# Patient Record
Sex: Female | Born: 1985 | Race: White | Hispanic: No | Marital: Single | Smoking: Current every day smoker
Health system: Southern US, Community
[De-identification: ages and names within clinical notes are randomized; demographics above are authoritative.]

## PROBLEM LIST (undated history)

## (undated) VITALS — BP 108/75 | HR 94 | Temp 98.5°F | Resp 18 | Ht 61.0 in | Wt 92.5 lb

## (undated) DIAGNOSIS — IMO0002 Reserved for concepts with insufficient information to code with codable children: Secondary | ICD-10-CM

## (undated) DIAGNOSIS — F99 Mental disorder, not otherwise specified: Secondary | ICD-10-CM

## (undated) DIAGNOSIS — F319 Bipolar disorder, unspecified: Secondary | ICD-10-CM

## (undated) DIAGNOSIS — R87619 Unspecified abnormal cytological findings in specimens from cervix uteri: Secondary | ICD-10-CM

## (undated) DIAGNOSIS — F32A Depression, unspecified: Secondary | ICD-10-CM

## (undated) DIAGNOSIS — F419 Anxiety disorder, unspecified: Secondary | ICD-10-CM

## (undated) DIAGNOSIS — F329 Major depressive disorder, single episode, unspecified: Secondary | ICD-10-CM

## (undated) DIAGNOSIS — N83209 Unspecified ovarian cyst, unspecified side: Secondary | ICD-10-CM

## (undated) HISTORY — DX: Bipolar disorder, unspecified: F31.9

## (undated) HISTORY — PX: WISDOM TOOTH EXTRACTION: SHX21

## (undated) HISTORY — PX: COLPOSCOPY: SHX161

---
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
## 2006-01-10 ENCOUNTER — Ambulatory Visit: Payer: Self-pay | Admitting: Hematology & Oncology

## 2006-03-18 ENCOUNTER — Ambulatory Visit: Payer: Self-pay | Admitting: Hematology & Oncology

## 2006-03-23 LAB — CBC WITH DIFFERENTIAL/PLATELET
BASO%: 0.5 % (ref 0.0–2.0)
Eosinophils Absolute: 0.1 10*3/uL (ref 0.0–0.5)
MCHC: 35.3 g/dL (ref 32.0–36.0)
MONO#: 0.4 10*3/uL (ref 0.1–0.9)
NEUT#: 2.6 10*3/uL (ref 1.5–6.5)
RBC: 4.68 10*6/uL (ref 3.70–5.32)
WBC: 5.3 10*3/uL (ref 3.9–10.0)
lymph#: 2.2 10*3/uL (ref 0.9–3.3)

## 2006-03-23 LAB — CHCC SMEAR

## 2006-03-27 LAB — JAK2 GENOTYPR

## 2006-03-27 LAB — FERRITIN: Ferritin: 16 ng/mL (ref 10–291)

## 2006-09-17 ENCOUNTER — Ambulatory Visit: Payer: Self-pay | Admitting: Hematology & Oncology

## 2006-09-21 LAB — CBC WITH DIFFERENTIAL/PLATELET
BASO%: 0.4 % (ref 0.0–2.0)
LYMPH%: 39.5 % (ref 14.0–48.0)
MCHC: 35.4 g/dL (ref 32.0–36.0)
MONO#: 0.5 10*3/uL (ref 0.1–0.9)
NEUT#: 4.1 10*3/uL (ref 1.5–6.5)
RBC: 4.56 10*6/uL (ref 3.70–5.32)
RDW: 12.3 % (ref 11.3–14.5)
WBC: 7.8 10*3/uL (ref 3.9–10.0)
lymph#: 3.1 10*3/uL (ref 0.9–3.3)

## 2006-09-21 LAB — CHCC SMEAR

## 2006-12-22 ENCOUNTER — Other Ambulatory Visit: Admission: RE | Admit: 2006-12-22 | Discharge: 2006-12-22 | Payer: Self-pay | Admitting: Family Medicine

## 2007-12-23 ENCOUNTER — Other Ambulatory Visit: Admission: RE | Admit: 2007-12-23 | Discharge: 2007-12-23 | Payer: Self-pay | Admitting: Family Medicine

## 2009-04-16 ENCOUNTER — Ambulatory Visit (HOSPITAL_COMMUNITY): Admission: RE | Admit: 2009-04-16 | Discharge: 2009-04-16 | Payer: Self-pay | Admitting: Obstetrics & Gynecology

## 2009-07-14 ENCOUNTER — Inpatient Hospital Stay (HOSPITAL_COMMUNITY): Admission: AD | Admit: 2009-07-14 | Discharge: 2009-07-15 | Payer: Self-pay | Admitting: Obstetrics & Gynecology

## 2009-07-14 ENCOUNTER — Ambulatory Visit: Payer: Self-pay | Admitting: Advanced Practice Midwife

## 2009-08-02 ENCOUNTER — Inpatient Hospital Stay (HOSPITAL_COMMUNITY): Admission: AD | Admit: 2009-08-02 | Discharge: 2009-08-04 | Payer: Self-pay | Admitting: Obstetrics & Gynecology

## 2009-08-27 ENCOUNTER — Inpatient Hospital Stay (HOSPITAL_COMMUNITY): Admission: AD | Admit: 2009-08-27 | Discharge: 2009-08-27 | Payer: Self-pay | Admitting: Obstetrics and Gynecology

## 2009-08-29 ENCOUNTER — Inpatient Hospital Stay (HOSPITAL_COMMUNITY): Admission: AD | Admit: 2009-08-29 | Discharge: 2009-08-29 | Payer: Self-pay | Admitting: Obstetrics & Gynecology

## 2009-08-29 ENCOUNTER — Ambulatory Visit: Payer: Self-pay | Admitting: Family Medicine

## 2009-09-03 ENCOUNTER — Ambulatory Visit: Payer: Self-pay | Admitting: Obstetrics and Gynecology

## 2009-09-03 ENCOUNTER — Inpatient Hospital Stay (HOSPITAL_COMMUNITY): Admission: AD | Admit: 2009-09-03 | Discharge: 2009-09-04 | Payer: Self-pay | Admitting: Obstetrics & Gynecology

## 2009-09-17 ENCOUNTER — Inpatient Hospital Stay (HOSPITAL_COMMUNITY): Admission: AD | Admit: 2009-09-17 | Discharge: 2009-09-17 | Payer: Self-pay | Admitting: Obstetrics & Gynecology

## 2009-09-21 ENCOUNTER — Ambulatory Visit: Payer: Self-pay | Admitting: Obstetrics & Gynecology

## 2009-09-21 ENCOUNTER — Ambulatory Visit (HOSPITAL_COMMUNITY): Admission: RE | Admit: 2009-09-21 | Discharge: 2009-09-21 | Payer: Self-pay | Admitting: Family Medicine

## 2009-09-23 ENCOUNTER — Inpatient Hospital Stay (HOSPITAL_COMMUNITY): Admission: AD | Admit: 2009-09-23 | Discharge: 2009-09-23 | Payer: Self-pay | Admitting: Obstetrics & Gynecology

## 2009-09-24 ENCOUNTER — Inpatient Hospital Stay (HOSPITAL_COMMUNITY): Admission: AD | Admit: 2009-09-24 | Discharge: 2009-09-26 | Payer: Self-pay | Admitting: Obstetrics & Gynecology

## 2009-09-24 ENCOUNTER — Ambulatory Visit: Payer: Self-pay | Admitting: Family Medicine

## 2010-04-26 LAB — CBC
HCT: 40 % (ref 36.0–46.0)
Hemoglobin: 13.3 g/dL (ref 12.0–15.0)
MCH: 31.6 pg (ref 26.0–34.0)
MCHC: 33.3 g/dL (ref 30.0–36.0)
RBC: 4.21 MIL/uL (ref 3.87–5.11)
WBC: 15 10*3/uL — ABNORMAL HIGH (ref 4.0–10.5)

## 2010-04-27 LAB — COMPREHENSIVE METABOLIC PANEL
Alkaline Phosphatase: 184 U/L — ABNORMAL HIGH (ref 39–117)
BUN: 4 mg/dL — ABNORMAL LOW (ref 6–23)
CO2: 27 mEq/L (ref 19–32)
Chloride: 103 mEq/L (ref 96–112)
Creatinine, Ser: 0.56 mg/dL (ref 0.4–1.2)
GFR calc Af Amer: >60 mL/min (ref >60)
GFR calc non Af Amer: >60 mL/min (ref >60)
Glucose, Bld: 73 mg/dL (ref 70–99)
Potassium: 3.6 mEq/L (ref 3.5–5.1)
Total Bilirubin: 0.6 mg/dL (ref 0.3–1.2)

## 2010-04-27 LAB — URINALYSIS, ROUTINE W REFLEX MICROSCOPIC
Bilirubin Urine: NEGATIVE
Nitrite: NEGATIVE
Protein, ur: NEGATIVE mg/dL
Specific Gravity, Urine: 1.015 (ref 1.005–1.030)
Urobilinogen, UA: 0.2 mg/dL (ref 0.0–1.0)

## 2010-04-27 LAB — CBC
HCT: 34 % — ABNORMAL LOW (ref 36.0–46.0)
Hemoglobin: 11.8 g/dL — ABNORMAL LOW (ref 12.0–15.0)
WBC: 11.6 10*3/uL — ABNORMAL HIGH (ref 4.0–10.5)

## 2010-04-28 LAB — URINALYSIS, ROUTINE W REFLEX MICROSCOPIC
Bilirubin Urine: NEGATIVE
Glucose, UA: NEGATIVE mg/dL
Hgb urine dipstick: NEGATIVE
Specific Gravity, Urine: 1.01 (ref 1.005–1.030)
pH: 6 (ref 5.0–8.0)

## 2010-04-28 LAB — CBC
HCT: 31.8 % — ABNORMAL LOW (ref 36.0–46.0)
Hemoglobin: 11.2 g/dL — ABNORMAL LOW (ref 12.0–15.0)
MCV: 92.5 fL (ref 78.0–100.0)
RBC: 3.44 MIL/uL — ABNORMAL LOW (ref 3.87–5.11)
WBC: 10.5 10*3/uL (ref 4.0–10.5)

## 2010-04-28 LAB — KLEIHAUER-BETKE STAIN: Quantitation Fetal Hemoglobin: 0 mL

## 2010-04-28 LAB — GC/CHLAMYDIA PROBE AMP, GENITAL
Chlamydia, DNA Probe: NEGATIVE
GC Probe Amp, Genital: NEGATIVE

## 2010-04-28 LAB — URINE MICROSCOPIC-ADD ON

## 2010-04-28 LAB — STREP B DNA PROBE

## 2010-04-28 LAB — WET PREP, GENITAL: Yeast Wet Prep HPF POC: NONE SEEN

## 2010-04-29 LAB — WET PREP, GENITAL
Trich, Wet Prep: NONE SEEN
Yeast Wet Prep HPF POC: NONE SEEN

## 2010-04-29 LAB — URINALYSIS, ROUTINE W REFLEX MICROSCOPIC
Nitrite: NEGATIVE
Specific Gravity, Urine: <1.005 — ABNORMAL LOW (ref 1.005–1.030)
Urobilinogen, UA: 0.2 mg/dL (ref 0.0–1.0)
pH: 5.5 (ref 5.0–8.0)

## 2010-04-29 LAB — URINE MICROSCOPIC-ADD ON

## 2010-07-26 ENCOUNTER — Emergency Department (HOSPITAL_COMMUNITY)
Admission: EM | Admit: 2010-07-26 | Discharge: 2010-07-26 | Disposition: A | Discharge status: Home / Self Care | Payer: Medicaid Other | Attending: Emergency Medicine | Admitting: Emergency Medicine

## 2010-07-26 ENCOUNTER — Emergency Department (HOSPITAL_COMMUNITY): Payer: Medicaid Other

## 2010-07-26 DIAGNOSIS — N739 Female pelvic inflammatory disease, unspecified: Secondary | ICD-10-CM | POA: Insufficient documentation

## 2010-07-26 DIAGNOSIS — N760 Acute vaginitis: Secondary | ICD-10-CM | POA: Insufficient documentation

## 2010-07-26 DIAGNOSIS — B9689 Other specified bacterial agents as the cause of diseases classified elsewhere: Secondary | ICD-10-CM | POA: Insufficient documentation

## 2010-07-26 DIAGNOSIS — M545 Low back pain, unspecified: Secondary | ICD-10-CM | POA: Insufficient documentation

## 2010-07-26 DIAGNOSIS — R300 Dysuria: Secondary | ICD-10-CM | POA: Insufficient documentation

## 2010-07-26 DIAGNOSIS — R5381 Other malaise: Secondary | ICD-10-CM | POA: Insufficient documentation

## 2010-07-26 DIAGNOSIS — R5383 Other fatigue: Secondary | ICD-10-CM | POA: Insufficient documentation

## 2010-07-26 DIAGNOSIS — N949 Unspecified condition associated with female genital organs and menstrual cycle: Secondary | ICD-10-CM | POA: Insufficient documentation

## 2010-07-26 DIAGNOSIS — A499 Bacterial infection, unspecified: Secondary | ICD-10-CM | POA: Insufficient documentation

## 2010-07-26 DIAGNOSIS — Z30432 Encounter for removal of intrauterine contraceptive device: Secondary | ICD-10-CM | POA: Insufficient documentation

## 2010-07-26 DIAGNOSIS — R110 Nausea: Secondary | ICD-10-CM | POA: Insufficient documentation

## 2010-07-26 LAB — URINALYSIS, ROUTINE W REFLEX MICROSCOPIC
Glucose, UA: NEGATIVE mg/dL
Hgb urine dipstick: NEGATIVE
Protein, ur: NEGATIVE mg/dL
pH: 7 (ref 5.0–8.0)

## 2010-07-26 LAB — POCT I-STAT, CHEM 8
Creatinine, Ser: 0.6 mg/dL (ref 0.50–1.10)
HCT: 42 % (ref 36.0–46.0)
Hemoglobin: 14.3 g/dL (ref 12.0–15.0)
Potassium: 4 mEq/L (ref 3.5–5.1)
Sodium: 139 mEq/L (ref 135–145)
TCO2: 24 mmol/L (ref 0–100)

## 2010-07-26 LAB — CBC
HCT: 40 % (ref 36.0–46.0)
Hemoglobin: 13.4 g/dL (ref 12.0–15.0)
MCH: 29 pg (ref 26.0–34.0)
MCHC: 33.5 g/dL (ref 30.0–36.0)
MCV: 86.6 fL (ref 78.0–100.0)
Platelets: 379 10*3/uL (ref 150–400)

## 2010-07-26 LAB — DIFFERENTIAL
Lymphocytes Relative: 40 % (ref 12–46)
Lymphs Abs: 3.6 10*3/uL (ref 0.7–4.0)
Monocytes Absolute: 0.4 10*3/uL (ref 0.1–1.0)
Monocytes Relative: 5 % (ref 3–12)
Neutro Abs: 4.8 10*3/uL (ref 1.7–7.7)

## 2010-07-26 LAB — WET PREP, GENITAL: Yeast Wet Prep HPF POC: NONE SEEN

## 2010-07-29 ENCOUNTER — Inpatient Hospital Stay (HOSPITAL_COMMUNITY)
Admission: AD | Admit: 2010-07-29 | Discharge: 2010-07-29 | Disposition: A | Discharge status: Home / Self Care | Payer: Medicaid Other | Source: Ambulatory Visit | Attending: Obstetrics and Gynecology | Admitting: Obstetrics and Gynecology

## 2010-07-29 DIAGNOSIS — N949 Unspecified condition associated with female genital organs and menstrual cycle: Secondary | ICD-10-CM

## 2010-07-29 DIAGNOSIS — R109 Unspecified abdominal pain: Secondary | ICD-10-CM

## 2010-07-29 DIAGNOSIS — N938 Other specified abnormal uterine and vaginal bleeding: Secondary | ICD-10-CM | POA: Insufficient documentation

## 2010-07-29 LAB — URINALYSIS, ROUTINE W REFLEX MICROSCOPIC
Bilirubin Urine: NEGATIVE
Glucose, UA: NEGATIVE mg/dL
Protein, ur: NEGATIVE mg/dL
Specific Gravity, Urine: <1.005 — ABNORMAL LOW (ref 1.005–1.030)
Urobilinogen, UA: 0.2 mg/dL (ref 0.0–1.0)

## 2010-07-29 LAB — CBC
HCT: 40.5 % (ref 36.0–46.0)
MCH: 30.2 pg (ref 26.0–34.0)
MCV: 87.5 fL (ref 78.0–100.0)
Platelets: 369 10*3/uL (ref 150–400)
RDW: 12.9 % (ref 11.5–15.5)
WBC: 7.4 10*3/uL (ref 4.0–10.5)

## 2010-07-29 LAB — URINE MICROSCOPIC-ADD ON

## 2011-01-05 ENCOUNTER — Emergency Department (HOSPITAL_COMMUNITY): Payer: Medicaid Other

## 2011-01-05 ENCOUNTER — Emergency Department (HOSPITAL_COMMUNITY)
Admission: EM | Admit: 2011-01-05 | Discharge: 2011-01-05 | Disposition: A | Discharge status: Home / Self Care | Payer: Medicaid Other | Attending: Emergency Medicine | Admitting: Emergency Medicine

## 2011-01-05 DIAGNOSIS — R059 Cough, unspecified: Secondary | ICD-10-CM | POA: Insufficient documentation

## 2011-01-05 DIAGNOSIS — IMO0001 Reserved for inherently not codable concepts without codable children: Secondary | ICD-10-CM | POA: Insufficient documentation

## 2011-01-05 DIAGNOSIS — R05 Cough: Secondary | ICD-10-CM | POA: Insufficient documentation

## 2011-01-05 DIAGNOSIS — J069 Acute upper respiratory infection, unspecified: Secondary | ICD-10-CM

## 2011-01-05 MED ORDER — GUAIFENESIN ER 1200 MG PO TB12: 1.0000 | ORAL_TABLET | Freq: Two times a day (BID) | ORAL | Status: DC

## 2011-01-05 MED ORDER — PROMETHAZINE-DM 6.25-15 MG/5ML PO SYRP: 5.0000 mL | ORAL_SOLUTION | ORAL | Status: AC | PRN

## 2011-01-05 NOTE — ED Provider Notes (Signed)
Evaluation and management procedures were performed by the PA/NP under my supervision/collaboration.   Mickell Birdwell, MD 01/05/11 1536 

## 2011-01-05 NOTE — ED Provider Notes (Signed)
History     CSN: [id number] Arrival date & time: 01/05/2011 12:29 PM   First MD Initiated Contact with Patient 01/05/11 1238      Chief Complaint  Patient presents with  . Cough    pt states sneezing coughing x1 week no relief with otc medications also c/o generalized body aches     (Consider location/radiation/quality/duration/timing/severity/associated sxs/prior treatment) Patient is a 25 y.o. female presenting with cough. The history is provided by the patient.  Cough Associated symptoms include rhinorrhea and myalgias. Pertinent negatives include no chest pain, no ear congestion, no headaches, no sore throat, no shortness of breath and no wheezing. She has tried decongestants for the symptoms. The treatment provided no relief.   Patient has a one-week history of cough, nasal congestion, and runny nose and myalgias.  She denies fever, nausea, vomiting, diarrhea, abdominal pain, chest pain, shortness of breath, or headache.  Patient states, that she has not tried anything other than over-the-counter cough and cold medication.  States, that she came is because the cough is keeping her up at night. History reviewed. No pertinent past medical history.  History reviewed. No pertinent past surgical history.  No family history on file.  History  Substance Use Topics  . Smoking status: Current Some Day Smoker  . Smokeless tobacco: Not on file  . Alcohol Use: Yes    OB History    Grav Para Term Preterm Abortions TAB SAB Ect Mult Living                  Review of Systems  HENT: Positive for rhinorrhea. Negative for sore throat.   Respiratory: Positive for cough. Negative for shortness of breath and wheezing.   Cardiovascular: Negative for chest pain.  Musculoskeletal: Positive for myalgias.  Neurological: Negative for headaches.   no pertinent positives and negatives are in the history of present illness  Allergies  Review of patient's allergies indicates no known  allergies.  Home Medications  No current outpatient prescriptions on file.  BP 106/68  Pulse 72  Temp(Src) 97.9 F (36.6 C) (Oral)  Resp 20  SpO2 99%  LMP 12/11/2010  Physical Exam  Nursing note and vitals reviewed. Constitutional: She appears well-developed and well-nourished. No distress.  HENT:  Head: Normocephalic and atraumatic. No trismus in the jaw.  Right Ear: Tympanic membrane and ear canal normal.  Left Ear: Tympanic membrane and ear canal normal.  Nose: Mucosal edema and rhinorrhea present.  Mouth/Throat: Oropharynx is clear and moist and mucous membranes are normal. Normal dentition. No uvula swelling. No oropharyngeal exudate, posterior oropharyngeal edema, posterior oropharyngeal erythema or tonsillar abscesses.  Neck: Normal range of motion. Neck supple.  Cardiovascular: Normal rate, regular rhythm and normal heart sounds.  Exam reveals no gallop and no friction rub.   No murmur heard. Pulmonary/Chest: Effort normal. No respiratory distress. She has no wheezes. She has no rales.  Skin: Skin is warm and dry. No rash noted.    ED Course  Procedures (including critical care time)  The patient has a negative chest x-ray        MDM   Facial be treated for a viral URI based on her history of present illness, physical exam and x-ray findings.  She is told to increase her fluids, Tylenol and Motrin for pain.  Return here for any worsening in her condition       Carlyle Dolly, [place] 01/05/11 1351

## 2011-01-05 NOTE — ED Notes (Signed)
Patient transported to X-ray 

## 2011-02-08 ENCOUNTER — Encounter (HOSPITAL_COMMUNITY): Payer: Self-pay

## 2011-02-08 ENCOUNTER — Inpatient Hospital Stay (HOSPITAL_COMMUNITY)
Admission: AD | Admit: 2011-02-08 | Discharge: 2011-02-08 | Disposition: A | Discharge status: Home / Self Care | Payer: Medicaid Other | Source: Ambulatory Visit | Attending: Obstetrics and Gynecology | Admitting: Obstetrics and Gynecology

## 2011-02-08 ENCOUNTER — Inpatient Hospital Stay (HOSPITAL_COMMUNITY): Payer: Medicaid Other

## 2011-02-08 DIAGNOSIS — R102 Pelvic and perineal pain: Secondary | ICD-10-CM

## 2011-02-08 DIAGNOSIS — N949 Unspecified condition associated with female genital organs and menstrual cycle: Secondary | ICD-10-CM | POA: Insufficient documentation

## 2011-02-08 HISTORY — DX: Unspecified abnormal cytological findings in specimens from cervix uteri: R87.619

## 2011-02-08 HISTORY — DX: Unspecified ovarian cyst, unspecified side: N83.209

## 2011-02-08 HISTORY — DX: Reserved for concepts with insufficient information to code with codable children: IMO0002

## 2011-02-08 LAB — WET PREP, GENITAL
Trich, Wet Prep: NONE SEEN
Yeast Wet Prep HPF POC: NONE SEEN

## 2011-02-08 LAB — URINALYSIS, ROUTINE W REFLEX MICROSCOPIC
Bilirubin Urine: NEGATIVE
Leukocytes, UA: NEGATIVE
Nitrite: NEGATIVE
Specific Gravity, Urine: 1.02 (ref 1.005–1.030)
Urobilinogen, UA: 0.2 mg/dL (ref 0.0–1.0)

## 2011-02-08 NOTE — Progress Notes (Signed)
Patient is here with c/o ongoing abdominal pain, she states that she had constant pain after her mirena iud was removed in June. She states that she was told that it is her ovaries. She c/o rlq pain, that is tender and intensifies with movement. She denies any vaginal bleeding or discharge.

## 2011-02-08 NOTE — Progress Notes (Signed)
Written and verbal d/c instructions given and understanding voiced. Threasa Heads CNM in earlier to discuss u/s results and d/c with pt.

## 2011-02-08 NOTE — Progress Notes (Signed)
Pt reports having abd cramping on and off  X 1 week. Though it was an ovarian cyst. Pt reports  abd is bloated and feels her b"belly button" has disappeared (less pronounced)

## 2011-02-08 NOTE — ED Provider Notes (Signed)
History     Chief Complaint  Patient presents with  . Pelvic Pain   HPI  Pt here with report of constant pelvic cramping x 2-3 weeks.  Pain is described as sharp and rated 6/10.  Also feels like she has increased bloating.  Denies abnormal vaginal discharge or bleeding.    Past Medical History  Diagnosis Date  . Ovarian cyst   . Abnormal Pap smear     LSIL>normal follow-up pap    Past Surgical History  Procedure Date  . Colposcopy     History reviewed. No pertinent family history.  History  Substance Use Topics  . Smoking status: Current Some Day Smoker    Types: Cigarettes  . Smokeless tobacco: Not on file  . Alcohol Use: 1.2 oz/week    1 Glasses of wine, 1 Cans of beer per week    Allergies: No Known Allergies  Prescriptions prior to admission  Medication Sig Dispense Refill  . ALPRAZolam (XANAX) 0.5 MG tablet Take 0.5 mg by mouth 3 (three) times daily as needed. For anxiety.       Marland Kitchen DIAZEPAM PO Take 1 tablet by mouth daily as needed. Anxiety, Pt does not know strength with certainty; [facility] closed at time to verify.       Marland Kitchen ibuprofen (ADVIL,MOTRIN) 200 MG tablet Take 400 mg by mouth every 6 (six) hours as needed. For pain.         Review of Systems  Constitutional: Negative.   Gastrointestinal: Positive for abdominal pain. Negative for nausea and vomiting.       Bloating  Genitourinary: Negative.   All other systems reviewed and are negative.   Physical Exam   Blood pressure 130/71, pulse 94, temperature 98.8 F (37.1 C), temperature source Oral, resp. rate 18, height 5\' 1"  (1.549 m), weight 42.185 kg (93 lb), last menstrual period 12/29/2010.  Physical Exam  Constitutional: She is oriented to person, place, and time. She appears well-developed and well-nourished. No distress.  HENT:  Head: Normocephalic.  Neck: Normal range of motion. Neck supple.  Cardiovascular: Normal rate, regular rhythm and normal heart sounds.   Respiratory: Effort normal and  breath sounds normal.  GI: Soft. She exhibits no mass. There is tenderness (right sided). There is no guarding.  Genitourinary: Cervix exhibits no motion tenderness. Right adnexum displays mass and tenderness.       Negative cervical motion tenderness  Neurological: She is alert and oriented to person, place, and time. She has normal reflexes.  Skin: Skin is warm and dry.    MAU Course  Procedures  [place]: Normal ultrasound appearance of the uterus and ovaries. Interval  removal of intrauterine device. Small amount of free fluid in the  pelvis of nonspecific etiology, possibly physiologic.  Results for orders placed during the hospital encounter of 02/08/11 (from the past 24 hour(s))  URINALYSIS, ROUTINE W REFLEX MICROSCOPIC     Status: Normal   Collection Time   02/08/11  7:15 PM      Component Value Range   Color, Urine YELLOW  YELLOW    APPearance CLEAR  CLEAR    Specific Gravity, Urine 1.020  1.005 - 1.030    pH 6.0  5.0 - 8.0    Glucose, UA NEGATIVE  NEGATIVE (mg/dL)   Hgb urine dipstick NEGATIVE  NEGATIVE    Bilirubin Urine NEGATIVE  NEGATIVE    Ketones, ur NEGATIVE  NEGATIVE (mg/dL)   Protein, ur NEGATIVE  NEGATIVE (mg/dL)   Urobilinogen, UA 0.2  0.0 - 1.0 (mg/dL)   Nitrite NEGATIVE  NEGATIVE    Leukocytes, UA NEGATIVE  NEGATIVE   POCT PREGNANCY, URINE     Status: Normal   Collection Time   02/08/11  7:37 PM      Component Value Range   Preg Test, Ur NEGATIVE    WET PREP, GENITAL     Status: Abnormal   Collection Time   02/08/11  7:55 PM      Component Value Range   Yeast, Wet Prep NONE SEEN  NONE SEEN    Trich, Wet Prep NONE SEEN  NONE SEEN    Clue Cells, Wet Prep NONE SEEN  NONE SEEN    WBC, Wet Prep HPF POC FEW (*) NONE SEEN    Consult with Dr. Bonnita Nasuti home  Assessment and Plan  Pelvic Pain  DC to home F/u in office  [facility] 02/08/2011, 9:23 PM

## 2011-07-17 ENCOUNTER — Inpatient Hospital Stay (HOSPITAL_COMMUNITY): Payer: 59

## 2011-07-17 ENCOUNTER — Inpatient Hospital Stay (HOSPITAL_COMMUNITY)
Admission: AD | Admit: 2011-07-17 | Discharge: 2011-07-17 | Disposition: A | Discharge status: Home / Self Care | Payer: 59 | Source: Ambulatory Visit | Attending: Obstetrics and Gynecology | Admitting: Obstetrics and Gynecology

## 2011-07-17 ENCOUNTER — Encounter (HOSPITAL_COMMUNITY): Payer: Self-pay | Admitting: *Deleted

## 2011-07-17 DIAGNOSIS — R1031 Right lower quadrant pain: Secondary | ICD-10-CM

## 2011-07-17 DIAGNOSIS — N938 Other specified abnormal uterine and vaginal bleeding: Secondary | ICD-10-CM | POA: Insufficient documentation

## 2011-07-17 DIAGNOSIS — N939 Abnormal uterine and vaginal bleeding, unspecified: Secondary | ICD-10-CM

## 2011-07-17 DIAGNOSIS — N946 Dysmenorrhea, unspecified: Secondary | ICD-10-CM | POA: Insufficient documentation

## 2011-07-17 DIAGNOSIS — N898 Other specified noninflammatory disorders of vagina: Secondary | ICD-10-CM

## 2011-07-17 DIAGNOSIS — N949 Unspecified condition associated with female genital organs and menstrual cycle: Secondary | ICD-10-CM | POA: Insufficient documentation

## 2011-07-17 LAB — URINALYSIS, ROUTINE W REFLEX MICROSCOPIC
Glucose, UA: NEGATIVE mg/dL
Ketones, ur: NEGATIVE mg/dL
Protein, ur: NEGATIVE mg/dL

## 2011-07-17 LAB — CBC
HCT: 39.3 % (ref 36.0–46.0)
Hemoglobin: 12.9 g/dL (ref 12.0–15.0)
MCHC: 32.8 g/dL (ref 30.0–36.0)
WBC: 7.3 10*3/uL (ref 4.0–10.5)

## 2011-07-17 LAB — WET PREP, GENITAL
Clue Cells Wet Prep HPF POC: NONE SEEN
Trich, Wet Prep: NONE SEEN

## 2011-07-17 LAB — URINE MICROSCOPIC-ADD ON

## 2011-07-17 MED ORDER — KETOROLAC TROMETHAMINE 60 MG/2ML IM SOLN
60.0000 mg | Freq: Once | INTRAMUSCULAR | Status: AC
  Administered 2011-07-17: 60 mg via INTRAMUSCULAR
  Filled 2011-07-17: qty 2

## 2011-07-17 NOTE — MAU Provider Note (Signed)
History     CSN: [id number]  Arrival date and time: 07/17/11 1133   First Provider Initiated Contact with Patient 07/17/11 1308      Chief Complaint  Patient presents with  . Vaginal Bleeding  . Abdominal Pain   HPI Tara Thompson is 26 y.o. G1P1001 Patient of Dr. Eligha Bridegroom presents with bleeding for 10 days, cramping, nausea and diarrhea.  Pain is on the right occurs more with movement, "bad menstrual cramping".  Menses began 5/29, expected on that date.  Hx ovarian cysts for 9 years.  Saw Dr. Milana Kidney in September.  Had Mirena IUD removed 6/12 and states she has had problems since.        Past Medical History  Diagnosis Date  . Ovarian cyst   . Abnormal Pap smear     LSIL>normal follow-up pap    Past Surgical History  Procedure Date  . Colposcopy     History reviewed. No pertinent family history.  History  Substance Use Topics  . Smoking status: Current Some Day Smoker    Types: Cigarettes  . Smokeless tobacco: Not on file  . Alcohol Use: 1.2 oz/week    1 Glasses of wine, 1 Cans of beer per week     occasional    Allergies: No Known Allergies  Prescriptions prior to admission  Medication Sig Dispense Refill  . ALPRAZolam (XANAX) 0.5 MG tablet Take 0.5 mg by mouth 3 (three) times daily as needed. For anxiety.       Marland Kitchen DIAZEPAM PO Take 1 tablet by mouth daily as needed. Anxiety, Pt does not know strength with certainty; [facility] closed at time to verify.       Marland Kitchen ibuprofen (ADVIL,MOTRIN) 200 MG tablet Take 400 mg by mouth every 6 (six) hours as needed. For pain.         Review of Systems  Constitutional: Negative.   Gastrointestinal: Positive for nausea, vomiting and abdominal pain.  Genitourinary:       + lengthy vaginal bleeding   Physical Exam   Blood pressure 139/84, pulse 110, temperature 99.2 F (37.3 C), temperature source Oral, resp. rate 16, height 5' 0.75" (1.543 m), weight 41.822 kg (92 lb 3.2 oz), last menstrual period 07/09/2011, SpO2  100.00%.  Physical Exam  Constitutional: She appears well-developed and well-nourished. No distress.  HENT:  Head: Normocephalic.  Neck: Normal range of motion.  Cardiovascular: Normal rate.   Respiratory: Effort normal.  GI: Soft. She exhibits no distension and no mass. There is tenderness (right lower quadrant). There is no rebound and no guarding.  Genitourinary: Uterus is not enlarged and not tender. Cervix exhibits no motion tenderness, no discharge and no friability. Right adnexum displays tenderness and fullness. Right adnexum displays no mass. Left adnexum displays no mass, no tenderness and no fullness. There is bleeding (small amount of bright red blood with one small clot) around the vagina.   Results for orders placed during the hospital encounter of 07/17/11 (from the past 24 hour(s))  URINALYSIS, ROUTINE W REFLEX MICROSCOPIC     Status: Abnormal   Collection Time   07/17/11 12:00 PM      Component Value Range   Color, Urine YELLOW  YELLOW    APPearance CLEAR  CLEAR    Specific Gravity, Urine <1.005 (*) 1.005 - 1.030    pH 6.5  5.0 - 8.0    Glucose, UA NEGATIVE  NEGATIVE (mg/dL)   Hgb urine dipstick LARGE (*) NEGATIVE    Bilirubin Urine NEGATIVE  NEGATIVE    Ketones, ur NEGATIVE  NEGATIVE (mg/dL)   Protein, ur NEGATIVE  NEGATIVE (mg/dL)   Urobilinogen, UA 0.2  0.0 - 1.0 (mg/dL)   Nitrite NEGATIVE  NEGATIVE    Leukocytes, UA TRACE (*) NEGATIVE   URINE MICROSCOPIC-ADD ON     Status: Abnormal   Collection Time   07/17/11 12:00 PM      Component Value Range   Squamous Epithelial / LPF FEW (*) RARE    WBC, UA 3-6  <3 (WBC/hpf)   RBC / HPF 3-6  <3 (RBC/hpf)  POCT PREGNANCY, URINE     Status: Normal   Collection Time   07/17/11 12:08 PM      Component Value Range   Preg Test, Ur NEGATIVE  NEGATIVE   CBC     Status: Normal   Collection Time   07/17/11 12:30 PM      Component Value Range   WBC 7.3  4.0 - 10.5 (K/uL)   RBC 4.46  3.87 - 5.11 (MIL/uL)   Hemoglobin 12.9  12.0 -  15.0 (g/dL)   HCT 96.0  45.4 - 09.8 (%)   MCV 88.1  78.0 - 100.0 (fL)   MCH 28.9  26.0 - 34.0 (pg)   MCHC 32.8  30.0 - 36.0 (g/dL)   RDW 11.9  14.7 - 82.9 (%)   Platelets 396  150 - 400 (K/uL)  WET PREP, GENITAL     Status: Abnormal   Collection Time   07/17/11  1:30 PM      Component Value Range   Yeast Wet Prep HPF POC NONE SEEN  NONE SEEN    Trich, Wet Prep NONE SEEN  NONE SEEN    Clue Cells Wet Prep HPF POC NONE SEEN  NONE SEEN    WBC, Wet Prep HPF POC FEW (*) NONE SEEN          *RADIOLOGY REPORT*  Clinical Data: Lower quadrant pain and dysfunctional uterine  bleeding. LMP 07/09/2011  TRANSABDOMINAL AND TRANSVAGINAL ULTRASOUND OF PELVIS  Technique: Both transabdominal and transvaginal ultrasound  examinations of the pelvis were performed. Transabdominal technique  was performed for global imaging of the pelvis including uterus,  ovaries, adnexal regions, and pelvic cul-de-sac.  Comparison: 02/08/2011  It was necessary to proceed with endovaginal exam following the  transabdominal exam to visualize the adnexa.  Findings:  Uterus: The uterus demonstrates a sagittal length of 7.0 cm, depth  of 3.1 cm in width of 4.6 cm. A homogeneous uterine myometrium is  seen peri  Endometrium: Appears thin and echogenic with an AP width of 4.0 mm.  No areas of focal thickening or heterogeneity are seen. This would  correlate with a postsecretory endometrial stripe and correspond  with the provided LMP of 07/09/2011  Right ovary: Measures 3.4 x 2.2 x 3.2 cm (12.4 cc) and has a  normal appearance  Left ovary: Measures 3.9 x 2.2 x 2.2 cm (9.8 cc) and has a normal  appearance  Other findings: A small amount of simple free fluid is noted in the  cul-de-sac.  IMPRESSION:  Normal postsecretory pelvic ultrasound.  Original Report Authenticated By: Bertha Stakes, M.D.    Signed  Date/Time    Phone  Pager    Rhodia Albright S  07/17/2011  3:27 PM EDT        MAU Course   Procedures  MDM 13:20  Reported MSE to Dr. Ambrose Mantle.  Order given for CBC, UPT and exam to rule out an  acute abdomen. 14:15  Reported physical exam findings and lab results.  Order given for pelvic ultrasound 15:44  Reported ultrasound report.  May give toradol for pain.  Discharge to home.  Follow up with Dr. Jackelyn Knife.             Toradol 60mg  IM ordered  Assessment and Plan  A: abnormal vaginal bleeding     Dysmenorrhea  P:  Discharged to home with instructions to follow up with Dr. Sheron Nightingale 07/17/2011, 1:11 PM

## 2011-07-17 NOTE — Discharge Instructions (Signed)
Abnormal Vaginal Bleeding Abnormal vaginal bleeding means bleeding from the vagina that is not your normal menstrual period. Bleeding may be heavy or light. It may last for days or come and go. There are many problems that may cause this. HOME CARE  Keep track of your periods on a calendar if they are not regular.   Write down:   How often pads or tampons are changed.   The size and number of clots, if there are any.   A change in the color of the blood.   A change in the amount of blood.   Any smell.   The time and strength of cramps or pain.   Limit activity as told.   Eat a healthy diet.   Do not have sex (intercourse) until your doctor says it is okay.   Never have unprotected sex unless you are trying to get pregnant.   Only take medicine as told by your doctor.  GET HELP RIGHT AWAY IF:   You get dizzy or feel faint when standing up.   You have to change pads or tampons more than once an hour.   You feel a sudden change in your pain.   You start bleeding heavily.   You develop a fever.  MAKE SURE YOU:  Understand these instructions.   Will watch your condition.   Will get help right away if you are not doing well or get worse.  Document Released: 11/24/2008 Document Revised: 01/16/2011 Document Reviewed: 11/24/2008 [facility] Patient Information 2012 [place], [place].  DO NOT TAKE ANY ADDITIONAL IBUPROFEN UNTIL TOMORROW.

## 2011-07-17 NOTE — MAU Note (Signed)
Patient states she started bleeding on 5-28, stopped for 2 days then started again and is heavy with clots. Has been having lower abdominal and low back pain since about one week before she started her period. Has had 2 negative pregnancy tests over the past few week. Vomited x 1 yesterday.

## 2011-07-18 LAB — GC/CHLAMYDIA PROBE AMP, GENITAL
Chlamydia, DNA Probe: NEGATIVE
GC Probe Amp, Genital: NEGATIVE

## 2012-02-15 ENCOUNTER — Encounter: Payer: Self-pay | Admitting: Emergency Medicine

## 2012-02-15 ENCOUNTER — Emergency Department (INDEPENDENT_AMBULATORY_CARE_PROVIDER_SITE_OTHER)
Admission: EM | Admit: 2012-02-15 | Discharge: 2012-02-15 | Disposition: A | Discharge status: Home / Self Care | Payer: 59 | Source: Home / Self Care | Attending: Family Medicine | Admitting: Family Medicine

## 2012-02-15 DIAGNOSIS — R05 Cough: Secondary | ICD-10-CM

## 2012-02-15 DIAGNOSIS — R509 Fever, unspecified: Secondary | ICD-10-CM

## 2012-02-15 HISTORY — DX: Anxiety disorder, unspecified: F41.9

## 2012-02-15 MED ORDER — OSELTAMIVIR PHOSPHATE 75 MG PO CAPS: 75.0000 mg | ORAL_CAPSULE | Freq: Two times a day (BID) | ORAL | Status: DC

## 2012-02-15 MED ORDER — BENZONATATE 200 MG PO CAPS: 200.0000 mg | ORAL_CAPSULE | Freq: Every day | ORAL | Status: DC

## 2012-02-15 NOTE — ED Provider Notes (Signed)
History     CSN: [id number]  Arrival date & time 02/15/12  1425   First MD Initiated Contact with Patient 02/15/12 1605      Chief Complaint  Patient presents with  . Cough  . Generalized Body Aches  . Nasal Congestion      HPI Comments: Reports 2 days of cough with associated upper chest wall pain, nasal congestion and body aches; no known fever. Has not had Flu vaccination this season.  The history is provided by the patient.    Past Medical History  Diagnosis Date  . Ovarian cyst   . Abnormal Pap smear     LSIL>normal follow-up pap  . Anxiety     Past Surgical History  Procedure Date  . Colposcopy     History reviewed. No pertinent family history.  History  Substance Use Topics  . Smoking status: Current Some Day Smoker    Types: Cigarettes  . Smokeless tobacco: Not on file  . Alcohol Use: 1.2 oz/week    1 Glasses of wine, 1 Cans of beer per week     Comment: occasional    OB History    Grav Para Term Preterm Abortions TAB SAB Ect Mult Living   1 1 1       1       Review of Systems + sore throat + cough No pleuritic pain No wheezing + nasal congestion ? post-nasal drainage No sinus pain/pressure No itchy/red eyes ? earache No hemoptysis No SOB No fever, + chills No nausea No vomiting No abdominal pain No diarrhea No urinary symptoms No skin rashes + fatigue + myalgias + headache   Allergies  Review of patient's allergies indicates no known allergies.  Home Medications   Current Outpatient Rx  Name  Route  Sig  Dispense  Refill  . SERTRALINE HCL 25 MG PO TABS   Oral   Take 25 mg by mouth daily.         Marland Kitchen BENZONATATE 200 MG PO CAPS   Oral   Take 1 capsule (200 mg total) by mouth at bedtime. Take as needed for cough   12 capsule   0   . DIAZEPAM PO   Oral   Take 5 mg by mouth daily as needed. For Anxiety         . OSELTAMIVIR PHOSPHATE 75 MG PO CAPS   Oral   Take 1 capsule (75 mg total) by mouth every 12 (twelve)  hours.   10 capsule   0     BP 104/63  Pulse 101  Temp 98.5 F (36.9 C) (Oral)  Resp 16  Ht 5' (1.524 m)  Wt 100 lb (45.36 kg)  BMI 19.53 kg/m2  SpO2 99%  LMP 02/15/2012  Physical Exam Nursing notes and Vital Signs reviewed. Appearance:  Patient appears healthy, stated age, and in no acute distress Eyes:  Pupils are equal, round, and reactive to light and accomodation.  Extraocular movement is intact.  Conjunctivae are not inflamed  Ears:  Canals normal.  Tympanic membranes normal.  Nose:  Mildly congested turbinates.  No sinus tenderness.   Pharynx:  Normal Neck:  Supple.  Slightly tender shotty posterior nodes are palpated bilaterally  Lungs:  Clear to auscultation.  Breath sounds are equal.  Chest:   Tenderness to palpation over the mid-sternum.  Heart:  Regular rate and rhythm without murmurs, rubs, or gallops.  Abdomen:  Nontender without masses or hepatosplenomegaly.  Bowel sounds are present.  No  CVA or flank tenderness.  Extremities:  No edema.  No calf tenderness Skin:  No rash present.   ED Course  Procedures none      1. Influenza-like illness       MDM  Begin Tamiflu.  Prescription written for Benzonatate [facility]) to take at bedtime for night-time cough.  Take Mucinex D (guaifenesin with decongestant) twice daily for congestion.  Increase fluid intake, rest. May use Afrin nasal spray (or generic oxymetazoline) twice daily for about 5 days.  Also recommend using saline nasal spray several times daily and saline nasal irrigation (AYR is a common brand) Stop all antihistamines for now, and other non-prescription cough/cold preparations. May take Ibuprofen 200mg , 3 tabs every 8 hours with food for chest/sternum discomfort, headache, fever, etc. Recommend a flu shot when well.  Follow-up with family doctor if not improving about one week.         Lattie Haw, MD 02/19/12 [phone]

## 2012-02-15 NOTE — ED Notes (Signed)
Reports 3 days of cough with associated upper chest wall pain, nasal congestion and body aches; no known fever. Has not had Flu vaccination this season.

## 2012-03-25 ENCOUNTER — Emergency Department (HOSPITAL_COMMUNITY)
Admission: EM | Admit: 2012-03-25 | Discharge: 2012-03-25 | Disposition: A | Discharge status: Home / Self Care | Payer: 59 | Attending: Emergency Medicine | Admitting: Emergency Medicine

## 2012-03-25 ENCOUNTER — Emergency Department (HOSPITAL_COMMUNITY): Payer: 59

## 2012-03-25 ENCOUNTER — Encounter (HOSPITAL_COMMUNITY): Payer: Self-pay | Admitting: *Deleted

## 2012-03-25 DIAGNOSIS — R1031 Right lower quadrant pain: Secondary | ICD-10-CM | POA: Insufficient documentation

## 2012-03-25 DIAGNOSIS — F411 Generalized anxiety disorder: Secondary | ICD-10-CM | POA: Insufficient documentation

## 2012-03-25 DIAGNOSIS — Z79899 Other long term (current) drug therapy: Secondary | ICD-10-CM | POA: Insufficient documentation

## 2012-03-25 DIAGNOSIS — Z3202 Encounter for pregnancy test, result negative: Secondary | ICD-10-CM | POA: Insufficient documentation

## 2012-03-25 DIAGNOSIS — Z8742 Personal history of other diseases of the female genital tract: Secondary | ICD-10-CM | POA: Insufficient documentation

## 2012-03-25 DIAGNOSIS — R109 Unspecified abdominal pain: Secondary | ICD-10-CM

## 2012-03-25 LAB — CBC WITH DIFFERENTIAL/PLATELET
Basophils Absolute: 0.1 10*3/uL (ref 0.0–0.1)
Basophils Relative: 1 % (ref 0–1)
MCHC: 34.4 g/dL (ref 30.0–36.0)
Neutro Abs: 3.8 10*3/uL (ref 1.7–7.7)
Neutrophils Relative %: 44 % (ref 43–77)
Platelets: 412 10*3/uL — ABNORMAL HIGH (ref 150–400)
RDW: 12.8 % (ref 11.5–15.5)

## 2012-03-25 LAB — URINALYSIS, ROUTINE W REFLEX MICROSCOPIC
Bilirubin Urine: NEGATIVE
Ketones, ur: NEGATIVE mg/dL
Nitrite: NEGATIVE
Protein, ur: NEGATIVE mg/dL
Urobilinogen, UA: 1 mg/dL (ref 0.0–1.0)

## 2012-03-25 LAB — POCT PREGNANCY, URINE: Preg Test, Ur: NEGATIVE

## 2012-03-25 LAB — COMPREHENSIVE METABOLIC PANEL
AST: 16 U/L (ref 0–37)
Albumin: 4.4 g/dL (ref 3.5–5.2)
Alkaline Phosphatase: 72 U/L (ref 39–117)
Chloride: 102 mEq/L (ref 96–112)
Potassium: 3.6 mEq/L (ref 3.5–5.1)
Total Bilirubin: 0.5 mg/dL (ref 0.3–1.2)
Total Protein: 7.3 g/dL (ref 6.0–8.3)

## 2012-03-25 LAB — URINE MICROSCOPIC-ADD ON

## 2012-03-25 LAB — WET PREP, GENITAL
Clue Cells Wet Prep HPF POC: NONE SEEN
Trich, Wet Prep: NONE SEEN

## 2012-03-25 MED ORDER — OXYCODONE-ACETAMINOPHEN 5-325 MG PO TABS: 1.0000 | ORAL_TABLET | Freq: Four times a day (QID) | ORAL | Status: DC | PRN

## 2012-03-25 MED ORDER — ONDANSETRON 4 MG PO TBDP: 4.0000 mg | ORAL_TABLET | Freq: Four times a day (QID) | ORAL | Status: DC | PRN

## 2012-03-25 MED ORDER — HYDROMORPHONE HCL PF 1 MG/ML IJ SOLN: 1.0000 mg | Freq: Once | INTRAMUSCULAR | Status: DC

## 2012-03-25 MED ORDER — ONDANSETRON HCL 4 MG/2ML IJ SOLN
4.0000 mg | Freq: Once | INTRAMUSCULAR | Status: AC
  Administered 2012-03-25: 4 mg via INTRAVENOUS
  Filled 2012-03-25: qty 2

## 2012-03-25 MED ORDER — OXYCODONE-ACETAMINOPHEN 5-325 MG PO TABS
2.0000 | ORAL_TABLET | Freq: Once | ORAL | Status: AC
  Administered 2012-03-25: 2 via ORAL
  Filled 2012-03-25: qty 2

## 2012-03-25 MED ORDER — IOHEXOL 300 MG/ML  SOLN
20.0000 mL | INTRAMUSCULAR | Status: AC
  Administered 2012-03-25: 50 mL via ORAL

## 2012-03-25 MED ORDER — KETOROLAC TROMETHAMINE 30 MG/ML IJ SOLN
30.0000 mg | Freq: Once | INTRAMUSCULAR | Status: AC
  Administered 2012-03-25: 30 mg via INTRAVENOUS
  Filled 2012-03-25: qty 1

## 2012-03-25 MED ORDER — SODIUM CHLORIDE 0.9 % IV SOLN
Freq: Once | INTRAVENOUS | Status: AC
  Administered 2012-03-25: 19:00:00 via INTRAVENOUS

## 2012-03-25 MED ORDER — HYDROMORPHONE HCL PF 1 MG/ML IJ SOLN
1.0000 mg | Freq: Once | INTRAMUSCULAR | Status: AC
  Administered 2012-03-25: 1 mg via INTRAVENOUS
  Filled 2012-03-25: qty 1

## 2012-03-25 MED ORDER — IOHEXOL 300 MG/ML  SOLN
80.0000 mL | Freq: Once | INTRAMUSCULAR | Status: AC | PRN
  Administered 2012-03-25: 80 mL via INTRAVENOUS

## 2012-03-25 NOTE — ED Provider Notes (Signed)
History     CSN: [id number]  Arrival date & time 03/25/12  1816   First MD Initiated Contact with Patient 03/25/12 1846      Chief Complaint  Patient presents with  . Back Pain    (Consider location/radiation/quality/duration/timing/severity/associated sxs/prior treatment) Patient is a 27 y.o. female presenting with back pain. The history is provided by the patient.  Back Pain She is having is sharp, severe pain across her lower back and into her lower abdomen. Pain seems to move around. She tried lying on a heating pad which gave slight relief. She has not taken any medication. She rates pain at 10/10. She denies nausea, vomiting, constipation, diarrhea, dysuria. She denies fever or chills. Pain is getting worse. Last menses the cervix ago and was normal. She states that she is sexually abstinent.  Past Medical History  Diagnosis Date  . Ovarian cyst   . Abnormal Pap smear     LSIL>normal follow-up pap  . Anxiety     Past Surgical History  Procedure Laterality Date  . Colposcopy      No family history on file.  History  Substance Use Topics  . Smoking status: Current Some Day Smoker    Types: Cigarettes  . Smokeless tobacco: Not on file  . Alcohol Use: 1.2 oz/week    1 Glasses of wine, 1 Cans of beer per week     Comment: occasional    OB History   Grav Para Term Preterm Abortions TAB SAB Ect Mult Living   1 1 1       1       Review of Systems  Musculoskeletal: Positive for back pain.  All other systems reviewed and are negative.    Allergies  Review of patient's allergies indicates no known allergies.  Home Medications   Current Outpatient Rx  Name  Route  Sig  Dispense  Refill  . benzonatate (TESSALON) 200 MG capsule   Oral   Take 1 capsule (200 mg total) by mouth at bedtime. Take as needed for cough   12 capsule   0   . DIAZEPAM PO   Oral   Take 5 mg by mouth daily as needed. For Anxiety         . oseltamivir (TAMIFLU) 75 MG capsule  Oral   Take 1 capsule (75 mg total) by mouth every 12 (twelve) hours.   10 capsule   0   . sertraline (ZOLOFT) 25 MG tablet   Oral   Take 25 mg by mouth daily.           BP 100/78  Pulse 90  Temp(Src) 98.3 F (36.8 C)  Resp 18  SpO2 98%  LMP 03/01/2012  Physical Exam  Nursing note and vitals reviewed.  27 year old female, resting comfortably and in no acute distress. Vital signs are normal. Oxygen saturation is 98%, which is normal. Head is normocephalic and atraumatic. PERRLA, EOMI. Oropharynx is clear. Neck is nontender and supple without adenopathy or JVD. Back is nontender and there is no CVA tenderness. Lungs are clear without rales, wheezes, or rhonchi. Chest is nontender. Heart has regular rate and rhythm without murmur. Abdomen is soft, flat, with moderate tenderness well localized to the right lower quadrant. There is no rebound or guarding. There are no masses or hepatosplenomegaly and peristalsis is hypoactive. Extremities have no cyanosis or edema, full range of motion is present. Skin is warm and dry without rash. Neurologic: Mental status is normal, cranial nerves  are intact, there are no motor or sensory deficits.  ED Course  Procedures (including critical care time)  Results for orders placed during the hospital encounter of 03/25/12  CBC WITH DIFFERENTIAL      Result Value Range   WBC 8.5  4.0 - 10.5 K/uL   RBC 4.48  3.87 - 5.11 MIL/uL   Hemoglobin 13.3  12.0 - 15.0 g/dL   HCT 16.1  09.6 - 04.5 %   MCV 86.4  78.0 - 100.0 fL   MCH 29.7  26.0 - 34.0 pg   MCHC 34.4  30.0 - 36.0 g/dL   RDW 40.9  81.1 - 91.4 %   Platelets 412 (*) 150 - 400 K/uL   Neutrophils Relative 44  43 - 77 %   Neutro Abs 3.8  1.7 - 7.7 K/uL   Lymphocytes Relative 46  12 - 46 %   Lymphs Abs 3.9  0.7 - 4.0 K/uL   Monocytes Relative 8  3 - 12 %   Monocytes Absolute 0.7  0.1 - 1.0 K/uL   Eosinophils Relative 2  0 - 5 %   Eosinophils Absolute 0.1  0.0 - 0.7 K/uL   Basophils  Relative 1  0 - 1 %   Basophils Absolute 0.1  0.0 - 0.1 K/uL  COMPREHENSIVE METABOLIC PANEL      Result Value Range   Sodium 141  135 - 145 mEq/L   Potassium 3.6  3.5 - 5.1 mEq/L   Chloride 102  96 - 112 mEq/L   CO2 29  19 - 32 mEq/L   Glucose, Bld 70  70 - 99 mg/dL   BUN 11  6 - 23 mg/dL   Creatinine, Ser 7.82  0.50 - 1.10 mg/dL   Calcium 9.2  8.4 - 95.6 mg/dL   Total Protein 7.3  6.0 - 8.3 g/dL   Albumin 4.4  3.5 - 5.2 g/dL   AST 16  0 - 37 U/L   ALT 7  0 - 35 U/L   Alkaline Phosphatase 72  39 - 117 U/L   Total Bilirubin 0.5  0.3 - 1.2 mg/dL   GFR calc non Af Amer >90  >90 mL/min   GFR calc Af Amer >90  >90 mL/min  LIPASE, BLOOD      Result Value Range   Lipase 22  11 - 59 U/L  URINALYSIS, ROUTINE W REFLEX MICROSCOPIC      Result Value Range   Color, Urine YELLOW  YELLOW   APPearance CLEAR  CLEAR   Specific Gravity, Urine 1.027  1.005 - 1.030   pH 6.0  5.0 - 8.0   Glucose, UA NEGATIVE  NEGATIVE mg/dL   Hgb urine dipstick NEGATIVE  NEGATIVE   Bilirubin Urine NEGATIVE  NEGATIVE   Ketones, ur NEGATIVE  NEGATIVE mg/dL   Protein, ur NEGATIVE  NEGATIVE mg/dL   Urobilinogen, UA 1.0  0.0 - 1.0 mg/dL   Nitrite NEGATIVE  NEGATIVE   Leukocytes, UA SMALL (*) NEGATIVE  URINE MICROSCOPIC-ADD ON      Result Value Range   Squamous Epithelial / LPF FEW (*) RARE   WBC, UA 3-6  <3 WBC/hpf   RBC / HPF 0-2  <3 RBC/hpf   Bacteria, UA FEW (*) RARE   Urine-Other MUCOUS PRESENT    POCT PREGNANCY, URINE      Result Value Range   Preg Test, Ur NEGATIVE  NEGATIVE    1. Abdominal pain       MDM  Right lower quadrant pain which could be from an ovarian cyst, could be from appendicitis. Pelvic exam will be done and if no clear etiology is present at that point, will send for CT scan. She will be given hydromorphone for pain and ondansetron for nausea. Review of past records shows that she has been evaluated for pelvic pain several times and has had several pelvic ultrasounds.  She got  good pain relief with hydromorphone and ondansetron. Pelvic exam shows normal external female genitalia. There is some moderate mucoid discharge present but cervix is otherwise normal. There is no cervical motion tenderness and fundus is normal size and position. There is very mild right adnexal tenderness with no mass felt and there is no left adnexal tenderness. Her abdominal tenderness is clearly outside of the pelvic region. CT scan will be ordered to rule out appendicitis.  Dione Booze, MD 03/25/12 2009

## 2012-03-25 NOTE — ED Provider Notes (Signed)
10:56 PM  CT ABDOMEN AND PELVIS WITH CONTRAST Technique: Multidetector CT imaging of the abdomen and pelvis was performed following the standard protocol during bolus administration of intravenous contrast. Contrast: 80mL OMNIPAQUE IOHEXOL 300 MG/ML SOLN Comparison: 07/17/2011 Findings: The liver, spleen, pancreas, and adrenal glands appear unremarkable. The kidneys appear unremarkable, as do the proximal ureters. The gallbladder and biliary system appear unremarkable. No pathologic retroperitoneal or porta hepatis adenopathy is identified. No pathologic pelvic adenopathy is identified. Appendix normal. Urinary bladder unremarkable. Small follicles noted in both ovaries. No contour abnormality of the uterus observed. Mild dextroconvex lumbar scoliosis is present, possibly positional.  IMPRESSION: 1. No specific abnormality to account for the patient's abdominal and back pain.  Patient is nontoxic, nonseptic appearing, in no apparent distress.  Patient's pain and other symptoms adequately managed in emergency department.  Patient discharged home with symptomatic treatment and given strict instructions for follow-up with their OBGYN/ primary care physician.  I have also discussed reasons to return immediately to the ER.  Patient expresses understanding and agrees with plan.        Jaci Carrel, [place] 03/25/12 2257

## 2012-03-25 NOTE — ED Provider Notes (Signed)
House available for the completion of this patient's care.  Please see the initial physician for full details of the encounter  Gerhard Munch, MD 03/25/12 2316

## 2012-03-25 NOTE — ED Notes (Signed)
Called CT to inform them that pt is finished with the first cup of contrast

## 2012-03-25 NOTE — ED Notes (Signed)
Frequent bms bur no diarrhea

## 2012-03-25 NOTE — ED Notes (Signed)
Rt sided abd and back pain for 3-4 days.  No nv or diarrhea.  lmp 3 weeks ago.  Some vaginal dis charge for 3-4 weeks

## 2012-03-25 NOTE — ED Notes (Signed)
Pt states pain started about 2-3 days ago. Pt states pain feels like labor. Pt denies n/v/d. Pt alert and mentating appropriately. Pt states LMP was end of last month. Pt denies dysuria, denies hematuria, denies vaginal d/c.

## 2012-03-25 NOTE — ED Notes (Signed)
Pt ambulatory leaving ED with d/c paperwork and prescriptions. Pt has been instructed not to drive. Pt states that she has her mother coming to get her from the ED. Pt verbalizes understanding of her d/c teaching and prescriptions. Pt alert and mentating appropriately upon d/c. Pt has no further questions upon d/c.

## 2012-03-25 NOTE — ED Notes (Signed)
Unable to void now she just voided

## 2012-03-27 LAB — URINE CULTURE
Colony Count: NO GROWTH
Culture: NO GROWTH

## 2012-03-30 ENCOUNTER — Inpatient Hospital Stay (HOSPITAL_COMMUNITY)
Admission: AD | Admit: 2012-03-30 | Discharge: 2012-03-30 | Disposition: A | Discharge status: Home / Self Care | Payer: 59 | Source: Ambulatory Visit | Attending: Obstetrics and Gynecology | Admitting: Obstetrics and Gynecology

## 2012-03-30 ENCOUNTER — Encounter (HOSPITAL_COMMUNITY): Payer: Self-pay | Admitting: *Deleted

## 2012-03-30 DIAGNOSIS — N949 Unspecified condition associated with female genital organs and menstrual cycle: Secondary | ICD-10-CM | POA: Insufficient documentation

## 2012-03-30 DIAGNOSIS — R1031 Right lower quadrant pain: Secondary | ICD-10-CM | POA: Insufficient documentation

## 2012-03-30 DIAGNOSIS — R102 Pelvic and perineal pain: Secondary | ICD-10-CM

## 2012-03-30 MED ORDER — OXYCODONE-ACETAMINOPHEN 5-325 MG PO TABS: 1.0000 | ORAL_TABLET | ORAL | Status: DC | PRN

## 2012-03-30 NOTE — MAU Provider Note (Signed)
History     CSN: [id number]  Arrival date and time: 03/30/12 1746   First Provider Initiated Contact with Patient 03/30/12 1935      Chief Complaint  Patient presents with  . Abdominal Pain   HPI This is a 27 y.o. female who presents with c/o RLQ pain which has been there for some time. She thinks it is an ovarian cyst.  States has been to the ER and Dr Meisingers office and states they won't do anything  "I think I'm just gonna have to die or something".  States she wants no part of their office anymore.  She wants "everything taken out" but cannot come to the clinic during the day because she works.  "that is why I come to the ER at night".   Had PID treated in 07/2010 with Mirena IUD taken out (states the IUD 'almost killed me by giving me a stroke")  Denies fever or other symptoms. Used her last Percocet today. States office would not give her any more until she came to office in the morning.   RN Note: Patient states she has been having pain on the right side for about 10 years. Was worse when she had a Mirena for 2 years, was taken out 2012. Was seen at [facility] ED on 2-13 for a full exam. Patient states the pain continues on the right flank radiating to the right lower abdomen. Has had irregular bleeding for over one year. No bleeding at this time  OB History   Grav Para Term Preterm Abortions TAB SAB Ect Mult Living   1 1 1       1       Past Medical History  Diagnosis Date  . Ovarian cyst   . Abnormal Pap smear     LSIL>normal follow-up pap  . Anxiety     Past Surgical History  Procedure Laterality Date  . Colposcopy      History reviewed. No pertinent family history.  History  Substance Use Topics  . Smoking status: Current Some Day Smoker    Types: Cigarettes  . Smokeless tobacco: Not on file  . Alcohol Use: 1.2 oz/week    1 Glasses of wine, 1 Cans of beer per week     Comment: occasional    Allergies: No Known Allergies  Prescriptions prior to admission   Medication Sig Dispense Refill  . ondansetron (ZOFRAN ODT) 4 MG disintegrating tablet Take 1 tablet (4 mg total) by mouth every 6 (six) hours as needed for nausea.  6 tablet  0  . oxyCODONE-acetaminophen (PERCOCET/ROXICET) 5-325 MG per tablet Take 1 tablet by mouth every 6 (six) hours as needed for pain.  20 tablet  0    Review of Systems  Constitutional: Negative for fever and chills.  Gastrointestinal: Positive for abdominal pain. Negative for nausea, vomiting, diarrhea and constipation.  Genitourinary: Negative for dysuria.  Musculoskeletal: Negative for myalgias.  Psychiatric/Behavioral: The patient is nervous/anxious.    Physical Exam   Blood pressure 113/63, pulse 99, temperature 98.2 F (36.8 C), temperature source Oral, resp. rate 16, height 5' 0.5" (1.537 m), weight 98 lb 3.2 oz (44.543 kg), last menstrual period 03/01/2012, SpO2 100.00%.  Physical Exam  Constitutional: She is oriented to person, place, and time. She appears well-developed and well-nourished. No distress.  HENT:  Head: Normocephalic.  Cardiovascular: Normal rate.   Respiratory: Effort normal.  GI: Soft. She exhibits no distension and no mass. There is tenderness (over right lower/mid abdomen). There  is no rebound and no guarding.  There is a soft mass palpable in right lower/mid abdomen, that feels like muscle (oblique), that localizes the pain  Musculoskeletal: Normal range of motion.  Neurological: She is alert and oriented to person, place, and time.  Skin: Skin is warm and dry.  Psychiatric:  Agitated and anxious    MAU Course  Procedures  MDM Consulted Dr Jolayne Panther to come examine patient 2000: Dr. Jolayne Panther in to see the patient. Pt is OK for DC home. May have 10 percoet. Needs to schedule FU appointment in the GYN clinic.   Assessment and Plan   1. Pelvic pain in female    RX percocet 5/325 #10 FU with GYN clinic [facility] 03/30/2012, 7:40 PM

## 2012-03-30 NOTE — MAU Note (Signed)
Patient states she has been having pain on the right side for about 10 years. Was worse when she had a Mirena for 2 years, was taken out 2012. Was seen at [facility] ED on 2-13 for a full exam. Patient states the pain continues on the right flank radiating to the right lower abdomen. Has had irregular bleeding for over one year. No bleeding at this time.

## 2012-04-01 NOTE — MAU Provider Note (Signed)
Attestation of Attending Supervision of Advanced Practitioner (CNM/NP): Evaluation and management procedures were performed by the Advanced Practitioner under my supervision and collaboration.  I have reviewed the Advanced Practitioner's note and chart, and I agree with the management and plan.  Sumedha Munnerlyn 04/01/2012 12:14 PM

## 2012-04-05 ENCOUNTER — Encounter: Payer: Self-pay | Admitting: *Deleted

## 2012-04-05 ENCOUNTER — Telehealth: Payer: Self-pay | Admitting: Obstetrics and Gynecology

## 2012-04-05 NOTE — Telephone Encounter (Signed)
Patient called requesting Rx refilled for until her next follow up appointment. Please call patient back.

## 2012-04-06 NOTE — Telephone Encounter (Signed)
Patient left message stating that she needs Oxycodone 5/325mg .

## 2012-04-06 NOTE — Telephone Encounter (Signed)
Called pt and left message requesting her to call back with new message stating the name of medication she is requesting. We will then contact the doctor on call for consideration of a refill.

## 2012-04-07 ENCOUNTER — Ambulatory Visit (INDEPENDENT_AMBULATORY_CARE_PROVIDER_SITE_OTHER): Payer: 59 | Admitting: Obstetrics and Gynecology

## 2012-04-07 ENCOUNTER — Other Ambulatory Visit: Payer: Self-pay | Admitting: Medical

## 2012-04-07 ENCOUNTER — Encounter: Payer: Self-pay | Admitting: Obstetrics and Gynecology

## 2012-04-07 VITALS — BP 108/76 | HR 85 | Temp 97.7°F | Ht 61.0 in | Wt 95.3 lb

## 2012-04-07 DIAGNOSIS — R102 Pelvic and perineal pain: Secondary | ICD-10-CM

## 2012-04-07 DIAGNOSIS — N949 Unspecified condition associated with female genital organs and menstrual cycle: Secondary | ICD-10-CM

## 2012-04-07 MED ORDER — OXYCODONE-ACETAMINOPHEN 5-325 MG PO TABS: 1.0000 | ORAL_TABLET | Freq: Four times a day (QID) | ORAL | Status: DC | PRN

## 2012-04-07 NOTE — Progress Notes (Signed)
Ms. JOANNAH GITLIN is a 27 y.o. female who called MAU very angry that she was not given the Rx for Percocet that she was told she would get in the clinic today. She states that she was given the Rx and then it was taken away from her prior to her check-out from the clinic today. She is also very upset that she did not get an [place] today although it has been scheduled for 04/13/12. I discussed the patient with Dr. Despina Hidden and he recommends giving the patient a 1-day supply of Percocet and then discussing the patient with the clinic staff and Dr. Jolayne Panther tomorrow. I have told the patient that she can come and pick up her Rx in MAU today and I will call her tomorrow from GYN clinic after I have looked into the situation. The patient is understanding of this situation and agrees to this plan although she is still very angry and feels that she was not treated fairly and "ethically" while in the clinic today.

## 2012-04-07 NOTE — Progress Notes (Signed)
Patient ID: Tara Thompson, female   DOB: 1985-12-04, 27 y.o.   MRN: [id number] 27 yo G1P1 with LMP 03/01/2012 presenting today as MAU follow-up for pelvic pain. Patient states that the pain has been unchanged since her last visit in MAU on 04/09/2012. Patient describes her pain as sharp and Tara Thompson pain that originates in her lower back and radiates to her lower pelvis. Patient denies any dysuria, frequency or urgency. Patient is currently not sexually active and is not taking any birth control. Patient states that this pain has been present since June 2013. She is a former patient of Dr. Newt Minion and desires to transfer care as she feels her pain issues are not being addressed. She had a normal pelvic ultrasound in June 2013 and a normal CT scan on 03/25/2012  Past Medical History  Diagnosis Date  . Ovarian cyst   . Abnormal Pap smear     LSIL>normal follow-up pap  . Anxiety    Past Surgical History  Procedure Laterality Date  . Colposcopy     Family History  Problem Relation Age of Onset  . Cancer Paternal Grandmother    History  Substance Use Topics  . Smoking status: Current Every Day Smoker -- 1.00 packs/day    Types: Cigarettes  . Smokeless tobacco: Never Used  . Alcohol Use: 1.2 oz/week    1 Glasses of wine, 1 Cans of beer per week     Comment: occasional   GENERAL: Well-developed, well-nourished female in no acute distress.  ABDOMEN: Soft, nontender, nondistended. No organomegaly. PELVIC: Normal external female genitalia. Vagina is pink and rugated.  Normal discharge. Normal appearing cervix. Uterus is normal in size. No adnexal mass or tenderness. EXTREMITIES: No cyanosis, clubbing, or edema, 2+ distal pulses.  A/P 27 yo G1P1 with chronic pelvic pain  - Patient is convinces that she has cancer - Her physical exam actually appears much improved since I last saw her in the MAU on 2/28. Patient admits that her pain is mainly in her back - Patient agrees to pelvic ultrasound.  If normal, may need referral to orthopedic - Patient will be contacted with results of the ultrasound

## 2012-04-07 NOTE — Telephone Encounter (Signed)
Patient has an appointment today. Will discuss then.

## 2012-04-07 NOTE — MAU Provider Note (Signed)
Rx for Percocet as in Epic handwritten by Wynelle Bourgeois, CNM and given to patient. #6 with NO REFILLS.

## 2012-04-07 NOTE — Telephone Encounter (Signed)
Called pt to inform her of the importance of keeping her appt today. She stated that she has not slept in 2 days and she has actually arrived for her appt and is currently in the hospital parking lot. Pt will discuss her request with Dr. Jolayne Panther.

## 2012-04-08 ENCOUNTER — Telehealth: Payer: Self-pay | Admitting: *Deleted

## 2012-04-08 NOTE — Telephone Encounter (Signed)
Called patient per request of Joseph Berkshire, [place].  Raynelle Fanning states she was called by patient last night in MAU.  States patient requested pain medication.  States patient said she was prescribed percocet and zofran at her appointment yesterday and that one of the office staff took her prescription at checkout to fax to [facility].  Raynelle Fanning states she prescribed patient #6 (six) percocet last night until we could investigate today.  Raynelle Fanning states she has spoken with Dr. Jolayne Panther who saw the patient yesterday and Dr. Jolayne Panther states she did not prescribe any medications for this patient.  Notes also reflect that no pain medication was prescribed.  I phoned patient's cell and was able to speak with her.  She verified her date of birth.  I introduced myself and explained I was calling because I had been informed she had a less than optimal experience in our office yesterday.  The patient proceeded to thank me for calling and to tell me her story.    Patient states that she has been seen in the ER on 03/25/12 and seen in MAU on 03/30/12.  States she is in severe pain and has been since that time.  States she was seen in our office yesterday and the doctor wrote her a prescription for percocet and zofran.  States she was scheduled for an U/S on Tuesday 04/13/12.  States when she checked out the lady at the front "with the sweater on and the dreadlocks/braids", took her prescription from her and told her she was faxing [facility] to [facility].  States she thought this was very odd.  States she knows that this prescription could not be phoned in.  States [facility] went to [facility] and was told they had no prescriptions for her.  States she went there twice and then called MAU and spoke to [place] who prescribed her # 6 percocet.  Patient states that there is "something foul" going on in our office.  States maybe one of the nurses is a "pill pusher".  States there is a Engineer, civil (consulting) in our office that she knows and this nurse had nothing to do with her care but  that she came up to the patient and hugged her and tried to "get up in my business".  I asked patient who she was referring to.  Patient states [employer].  I stated, "Katie wasn't involved in your care yesterday, correct?"  Patient states she was not.  Patient states "I swear on my daughter's life I'm not lying.  I was given a prescription yesterday.  That girl at the front desk took my prescription.  Why would I call you and make this up?"  I told patient that I had investigated and would continue to investigate the issue.  Patient states she know something is wrong with her.  States she doesn't want to keep taking pain medication.  States she wants to know what is wrong.  States she thought she was to have an ultrasound yesterday so she could get some answers as to what is wrong with her.  States every time she comes in someone "fingers her" and doesn't tell her anything.  I explained to patient ultrasounds are not performed in our office.  I offered to call   Radiology to see if her ultrasound could be scheduled earlier than Tuesday.  She finally agreed.  I contacted Radiology and reschedule her appointment for tomorrow 04/09/12 at 10 am for ultrasound.  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  Asks to keep appointment for Tuesday.  Radiology contacted and appointment moved back to 04/13/12 at 7:30 am.  Patient states she doesn't want to see the doctor that she saw yesterday when she has the ultrasound.  I explained to patient what would happen at her ultrasound and that she will not see a doctor that day.  I also explained that she would not get results that day.  I explained once the scan was resulted the results would be sent to Dr. Jolayne Panther and once she reviews them she will let us know what to tell the patient.  I explained there may be a few different options - scan normal, so no GYN issue, needs to be seen in the office to  discuss options for treatment, etc.  Patient states understanding.  I explained that it may take 24 hours or more for the doctor to review the results and tell us to contact the patient.    Patient continued to go back over her story many times.  She continued to talk about the prescription being "taken" from her by the staff at the front desk.  She continued to talk about there being "something foul" going on in the office.  She continued to point out [employer] as being "in her business".  At one point in the conversation she asked to speak to my boss and before I could give her the name and phone number she was back to telling her story again.  Patient asked for pain medication.  I was very direct with the patient that we would not prescribe her any medication until she is further evaluated.  Patient became angry and yelling about what might happen to her and how she would have to "just deal with the pain".  Stated maybe she would just "hemorrhage to death" and nobody would care.  States "no one wanted to help her."  I told patient I am trying to help her.      Patient was quite animated at times during the conversation and at other times she was cussing and very angry.  It was very difficult to get her to let me speak.  She eventually apologized for talking so ugly to me and agreed to keep her appointment on 04/13/12 at 7:30 am for an ultrasound.  I was on the phone with her for 30 -45 minutes total.

## 2012-04-09 ENCOUNTER — Other Ambulatory Visit (HOSPITAL_COMMUNITY): Payer: 59

## 2012-04-09 NOTE — Telephone Encounter (Signed)
Thank you for taking the time to speak with the patient. I indeed did not provide her with a narcotic prescription because clinically she did not appear to be in pain and more importantly she did not request pain medication when I saw her. I also explained to her that I would contact her with ultrasound report whether normal or abnormal to discuss further treatment plan if indicated.  If patient calls requesting pain medication, she should be advised to present to the MAU/ED for evaluation.

## 2012-04-12 ENCOUNTER — Telehealth: Payer: Self-pay | Admitting: *Deleted

## 2012-04-12 NOTE — Telephone Encounter (Signed)
I was transferred this call from the front desk.  I introduced myself and reminded Tara Thompson that we had spoke via telephone last week.  Patient states she remembers me.  States she was contacted today about her ultrasound appointment tomorrow morning.  States the department called her to see if she would like to reschedule due to the weather.  The patient states she was glad they called as she is now bleeding and doesn't want to be examined while bleeding.  States she is wearing tampons and pads and bleeding very heavily.  Requests pain medication or muscle relaxer.  States her pain is "6" all the time and at times "10".  I explained to patient I would contact the Tara Thompson on-call and would call her back.  Patient states it doesn't have to be a narcotic that a muscle relaxer would be fine.  I offered for patient to come into the office to be seen this afternoon.  Patient states she is working until 4:30 pm.  States she cannot afford to go to [employer].  I explained to patient that if the doctor ordered narcotic that she would have to come in to pick up the prescription.  Patient states she can do that.    Spoke with Tara Thompson order received for Diclofenac DR 75 mg PO BID #60 no refills.    Telephoned patient back and explained the medication ordered.  Patient states she doesn't want it.  States she cannot afford it.  I explained to patient that I don't know how much it would cost but that I would be happy to phone it in and she could contact the drug store to check on the cost.  I explained the medication is in the ibuprofen family and is an anti-inflammatory which will hopefully give her some relief.  Patient asks if I explained to the doctor what she wanted and where she is hurting.  The patient states she doesn't want me to phone in the medication as she definitely cannot afford it.  States she is a single mother and is "so broke".  States she knows something is wrong with her.  States she is  going to keep her appointment for the ultrasound but that she intends to find another doctor to help her or maybe "I should just lynch myself in a tree.  You have no idea how I feel.  I'm so glad you don't have to go through what I am going through.  You would know how I feel if you did."  I continued to listen to the patient tell her story over and over and her intentions to find another Tara Thompson.  States she is so miserable.  States she has been on a heating pad for weeks, even months.  States she doesn't understand why no one will help her.  I again offered to phone in prescription for diclofenac.  Patient declined.  Patient states "something has been swept under the rug from my last visit and I intend to get to the bottom of it."  States she is hemorrhaging and this is why she wanted to get her ultrasound last week.  States if the ultrasound is performed now that they will not be able to see anything but all the blood.  States she is not in the medical field but she understands that much.  States she has done much research and knows something is wrong with her.  I again offer to phone in diclofenac.  Patient declines.  States she will find someone else to help her.

## 2012-04-13 ENCOUNTER — Other Ambulatory Visit (HOSPITAL_COMMUNITY): Payer: 59

## 2012-04-13 ENCOUNTER — Ambulatory Visit (HOSPITAL_COMMUNITY): Payer: 59

## 2012-04-15 ENCOUNTER — Encounter: Payer: Self-pay | Admitting: *Deleted

## 2012-04-19 ENCOUNTER — Ambulatory Visit (HOSPITAL_COMMUNITY): Payer: 59

## 2012-04-21 ENCOUNTER — Encounter: Payer: 59 | Admitting: Obstetrics & Gynecology

## 2012-04-22 ENCOUNTER — Ambulatory Visit (HOSPITAL_COMMUNITY)
Admission: RE | Admit: 2012-04-22 | Discharge: 2012-04-22 | Disposition: A | Discharge status: Home / Self Care | Payer: 59 | Source: Ambulatory Visit | Attending: Obstetrics and Gynecology | Admitting: Obstetrics and Gynecology

## 2012-04-22 DIAGNOSIS — G8929 Other chronic pain: Secondary | ICD-10-CM

## 2012-04-22 DIAGNOSIS — N926 Irregular menstruation, unspecified: Secondary | ICD-10-CM | POA: Insufficient documentation

## 2012-04-22 DIAGNOSIS — N8300 Follicular cyst of ovary, unspecified side: Secondary | ICD-10-CM | POA: Insufficient documentation

## 2012-04-22 DIAGNOSIS — N949 Unspecified condition associated with female genital organs and menstrual cycle: Secondary | ICD-10-CM | POA: Insufficient documentation

## 2012-04-26 ENCOUNTER — Telehealth: Payer: Self-pay | Admitting: Obstetrics and Gynecology

## 2012-04-26 ENCOUNTER — Telehealth: Payer: Self-pay | Admitting: *Deleted

## 2012-04-26 NOTE — Telephone Encounter (Signed)
Message copied by Mannie Stabile on Mon Apr 26, 2012  1:33 PM ------      Message from: CONSTANT, PEGGY      Created: Thu Apr 22, 2012  5:45 PM       Please inform patient of normal ultrasound findings. She may schedule a follow-up appointment if she wishes to be started on birth control or for annual exams. There is, however, no GYN source for the pain that she describes. ------

## 2012-04-26 NOTE — Telephone Encounter (Signed)
Patient contacted and reviewed ultrasound findings: Findings:  Uterus: 7.6 x 3.0 x 4.5 cm. No fibroids or other uterine mass  identified.  Endometrium: Double layer thickness measures 2 mm transvaginally.  No focal lesion visualized.  Right ovary: 3.3 x 1.8 x 2.2 cm. Normal appearance. No ovarian or  adnexal mass identified peri  Left ovary: 3.5 x 2.0 x 1.9 cm. Normal appearance. Dominant 11 mm  follicle noted. No ovarian or adnexal mass identified.  Other Findings: No free fluid  IMPRESSION:  Negative. No evidence of pelvic mass or other significant  abnormality.  Patient stated that she was tired of bleeding everyday. Informed patient that medical management with birth control may be the solution to her abnormal uterine bleeding. Different birth control options were discussed and patient refused OCP, nuvaring and Mirena IUD. She desires "something that is not going to kill me". Explained to patient that every medication has a side effect profile and we all have to see which outweigh the risks and are more beneficial to each individual patient. She agreed to try depo-provera. Patient will come in to receive depo-provera. Patient was advised to abstain from unprotected intercourse and to use back up birth control for 3 weeks after receiving first injection. It was also explained to the patient that she may not see a difference in her bleeding pattern until she receives the second dose of depo-provera. Patient verbalized understanding and all questions were answered. Patient did not complain of pelvic pain during our 10 minute phone conversation.

## 2012-04-26 NOTE — Telephone Encounter (Signed)
Pt spoke with Dr. Jolayne Panther and was given her results. Per Dr. Jolayne Panther patient can come in for depo injection. Pt is coming on Wednesday at 730 am for her injection. She states she has not had unprotected intercourse in the past 2 weeks.

## 2012-04-27 ENCOUNTER — Ambulatory Visit: Payer: 59

## 2012-04-27 NOTE — Telephone Encounter (Signed)
Erroneous encounter

## 2012-06-21 ENCOUNTER — Encounter (HOSPITAL_COMMUNITY): Payer: Self-pay | Admitting: Emergency Medicine

## 2012-06-21 ENCOUNTER — Encounter (HOSPITAL_COMMUNITY): Payer: Self-pay | Admitting: *Deleted

## 2012-06-21 ENCOUNTER — Inpatient Hospital Stay (HOSPITAL_COMMUNITY)
Admission: RE | Admit: 2012-06-21 | Discharge: 2012-06-24 | DRG: 885 | Disposition: A | Discharge status: Home / Self Care | Payer: 59 | Attending: Psychiatry | Admitting: Psychiatry

## 2012-06-21 ENCOUNTER — Emergency Department (HOSPITAL_COMMUNITY)
Admission: EM | Admit: 2012-06-21 | Discharge: 2012-06-21 | Disposition: A | Discharge status: Home / Self Care | Payer: 59 | Attending: Emergency Medicine | Admitting: Emergency Medicine

## 2012-06-21 DIAGNOSIS — F3160 Bipolar disorder, current episode mixed, unspecified: Principal | ICD-10-CM | POA: Diagnosis present

## 2012-06-21 DIAGNOSIS — R45851 Suicidal ideations: Secondary | ICD-10-CM

## 2012-06-21 DIAGNOSIS — F329 Major depressive disorder, single episode, unspecified: Secondary | ICD-10-CM

## 2012-06-21 DIAGNOSIS — F17200 Nicotine dependence, unspecified, uncomplicated: Secondary | ICD-10-CM | POA: Insufficient documentation

## 2012-06-21 DIAGNOSIS — Z8659 Personal history of other mental and behavioral disorders: Secondary | ICD-10-CM | POA: Insufficient documentation

## 2012-06-21 DIAGNOSIS — Z79899 Other long term (current) drug therapy: Secondary | ICD-10-CM

## 2012-06-21 DIAGNOSIS — Z8742 Personal history of other diseases of the female genital tract: Secondary | ICD-10-CM | POA: Insufficient documentation

## 2012-06-21 HISTORY — DX: Major depressive disorder, single episode, unspecified: F32.9

## 2012-06-21 HISTORY — DX: Mental disorder, not otherwise specified: F99

## 2012-06-21 HISTORY — DX: Depression, unspecified: F32.A

## 2012-06-21 LAB — ACETAMINOPHEN LEVEL: Acetaminophen (Tylenol), Serum: <15 ug/mL (ref 10–30)

## 2012-06-21 LAB — RAPID URINE DRUG SCREEN, HOSP PERFORMED
Amphetamines: NOT DETECTED
Barbiturates: NOT DETECTED
Benzodiazepines: POSITIVE — AB
Cocaine: NOT DETECTED
Opiates: NOT DETECTED
Tetrahydrocannabinol: POSITIVE — AB

## 2012-06-21 LAB — COMPREHENSIVE METABOLIC PANEL
AST: 16 U/L (ref 0–37)
BUN: 9 mg/dL (ref 6–23)
CO2: 25 mEq/L (ref 19–32)
Chloride: 101 mEq/L (ref 96–112)
Creatinine, Ser: 0.65 mg/dL (ref 0.50–1.10)
GFR calc Af Amer: >90 mL/min (ref >90)
GFR calc non Af Amer: >90 mL/min (ref >90)
Glucose, Bld: 107 mg/dL — ABNORMAL HIGH (ref 70–99)
Total Bilirubin: 0.6 mg/dL (ref 0.3–1.2)

## 2012-06-21 LAB — COMPREHENSIVE METABOLIC PANEL WITH GFR
ALT: 6 U/L (ref 0–35)
Albumin: 4.5 g/dL (ref 3.5–5.2)
Alkaline Phosphatase: 79 U/L (ref 39–117)
Calcium: 9.2 mg/dL (ref 8.4–10.5)
Potassium: 3.6 meq/L (ref 3.5–5.1)
Sodium: 137 meq/L (ref 135–145)
Total Protein: 7.4 g/dL (ref 6.0–8.3)

## 2012-06-21 LAB — CBC
HCT: 40.4 % (ref 36.0–46.0)
Hemoglobin: 13.5 g/dL (ref 12.0–15.0)
MCH: 28.7 pg (ref 26.0–34.0)
MCHC: 33.4 g/dL (ref 30.0–36.0)
MCV: 85.8 fL (ref 78.0–100.0)
Platelets: 388 K/uL (ref 150–400)
RBC: 4.71 MIL/uL (ref 3.87–5.11)
RDW: 12.5 % (ref 11.5–15.5)
WBC: 12.5 10*3/uL — ABNORMAL HIGH (ref 4.0–10.5)

## 2012-06-21 LAB — SALICYLATE LEVEL: Salicylate Lvl: <2 mg/dL — ABNORMAL LOW (ref 2.8–20.0)

## 2012-06-21 LAB — ETHANOL: Alcohol, Ethyl (B): <11 mg/dL (ref 0–11)
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
## 2012-06-21 NOTE — ED Provider Notes (Signed)
Medical screening examination/treatment/procedure(s) were performed by non-physician practitioner and as supervising physician I was immediately available for consultation/collaboration.   Gavin Pound. Oletta Lamas, MD 06/21/12 [id number]

## 2012-06-21 NOTE — Tx Team (Signed)
Initial Interdisciplinary Treatment Plan  PATIENT STRENGTHS: (choose at least two) Ability for insight Average or above average intelligence Capable of independent living Communication skills General fund of knowledge Motivation for treatment/growth Physical Health Supportive family/friends  PATIENT STRESSORS: Medication change or noncompliance Substance abuse   PROBLEM LIST: Problem List/Patient Goals Date to be addressed Date deferred Reason deferred Estimated date of resolution  Suicidal ideation 06/21/12     Substance use 06/21/12     depression 06/21/12     anxiety 06/21/12     Medication non-compliance 06/21/12     Low self esteem 06/21/12                        DISCHARGE CRITERIA:  Improved stabilization in mood, thinking, and/or behavior Motivation to continue treatment in a less acute level of care Need for constant or close observation no longer present Reduction of life-threatening or endangering symptoms to within safe limits Verbal commitment to aftercare and medication compliance  PRELIMINARY DISCHARGE PLAN: Outpatient therapy Participate in family therapy Return to previous living arrangement Return to previous work or school arrangements  PATIENT/FAMIILY INVOLVEMENT: This treatment plan has been presented to and reviewed with the patient, Tara Thompson  The patient and family have been given the opportunity to ask questions and make suggestions.  Hoover Browns 06/21/2012, 11:14 PM

## 2012-06-21 NOTE — ED Notes (Signed)
patient is anxious. [facility] called and made aware, Awaiting paper work for admission.
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
## 2012-06-21 NOTE — ED Notes (Signed)
Pt here from [facility]. Per report, pt "having suicidal thoughts of putting a gun to her head"

## 2012-06-21 NOTE — ED Notes (Signed)
MD at bedside. 

## 2012-06-21 NOTE — ED Provider Notes (Signed)
History     CSN: [id number]  Arrival date & time 06/21/12  1819   First MD Initiated Contact with Patient 06/21/12 1858      Chief Complaint  Patient presents with  . Medical Clearance    (Consider location/radiation/quality/duration/timing/severity/associated sxs/prior treatment) HPI Patient presents to the ER with suicidal thoughts and depression that has been increasing over the last 2 weeks. The patient denies chest pain, sob, vomiting, hallucinations, weakness, headaches, dizziness or syncope. The patient states that she has not been eating or sleeping.   Past Medical History  Diagnosis Date  . Ovarian cyst   . Abnormal Pap smear     LSIL>normal follow-up pap  . Anxiety   . Mental disorder   . Depression     Past Surgical History  Procedure Laterality Date  . Colposcopy      Family History  Problem Relation Age of Onset  . Cancer Paternal Grandmother     History  Substance Use Topics  . Smoking status: Current Every Day Smoker -- 1.00 packs/day    Types: Cigarettes  . Smokeless tobacco: Never Used  . Alcohol Use: 1.2 oz/week    1 Glasses of wine, 1 Cans of beer per week     Comment: occasional    OB History   Grav Para Term Preterm Abortions TAB SAB Ect Mult Living   1 1 1       1       Review of Systems All other systems negative except as documented in the HPI. All pertinent positives and negatives as reviewed in the HPI.  Allergies  Review of patient's allergies indicates no known allergies.  Home Medications   Current Outpatient Rx  Name  Route  Sig  Dispense  Refill  . ibuprofen (ADVIL,MOTRIN) 600 MG tablet   Oral   Take 600 mg by mouth every 6 (six) hours as needed for pain.         Marland Kitchen ondansetron (ZOFRAN ODT) 4 MG disintegrating tablet   Oral   Take 1 tablet (4 mg total) by mouth every 6 (six) hours as needed for nausea.   6 tablet   0   . oxyCODONE-acetaminophen (PERCOCET/ROXICET) 5-325 MG per tablet   Oral   Take 1 tablet by  mouth every 6 (six) hours as needed for pain.   6 tablet   0     BP 110/77  Pulse 85  Temp(Src) 98.7 F (37.1 C) (Oral)  Resp 22  SpO2 99%  Physical Exam  Nursing note and vitals reviewed. Constitutional: She appears well-developed and well-nourished.  HENT:  Head: Normocephalic and atraumatic.  Mouth/Throat: Oropharynx is clear and moist.  Eyes: Pupils are equal, round, and reactive to light.  Cardiovascular: Normal rate, regular rhythm and normal heart sounds.  Exam reveals no gallop and no friction rub.   No murmur heard. Pulmonary/Chest: Effort normal and breath sounds normal.  Skin: Skin is warm and dry.  Psychiatric: Her mood appears anxious. Her affect is angry and blunt. She is agitated. She is not aggressive, not hyperactive, not slowed, not withdrawn and not actively hallucinating. Thought content is not paranoid and not delusional. Cognition and memory are not impaired. She exhibits a depressed mood. She expresses suicidal ideation. She expresses no homicidal ideation. She expresses suicidal plans. She expresses no homicidal plans.    ED Course  Procedures (including critical care time)  Labs Reviewed  ACETAMINOPHEN LEVEL  CBC  COMPREHENSIVE METABOLIC PANEL  ETHANOL  SALICYLATE LEVEL  URINE RAPID DRUG SCREEN (HOSP PERFORMED)  Patient was at [facility] and needs medical clearance.   MDM          Carlyle Dolly, PA-C 06/21/12 1908

## 2012-06-21 NOTE — BH Assessment (Signed)
[facility] Assessment Progress Note      This Clinical research associate spoke with charge nurse Arvilla Meres., informed nurse that pt will be transferred from [facility] to [facility] for medical clearance. Informed charge nurse per [facility] and Dr. Lolly Mustache at [facility], the following labs are requested CBC,Urinalysis,UDS,ETOH,C-Met,Pregnancy, and TSH. Pt has been accepted to [facility] once she is medically cleared. If and when pt is medically cleared she will be assigned to 501-1 by Dr. Kathryne Sharper assigned to Dr. Patrick North.    Pt is not to be transferred back to [facility] until labs have been reviewed by AC/MD at [facility] and ACT Team has been notified. Melynda Ripple assessment counselor at [facility] notified of this plan.  Glorious Peach, MS, LCASA Assessment Counselor

## 2012-06-22 MED ORDER — ACETAMINOPHEN 325 MG PO TABS: 650.0000 mg | ORAL_TABLET | Freq: Four times a day (QID) | ORAL | Status: DC | PRN

## 2012-06-22 MED ORDER — HYDROXYZINE HCL 50 MG PO TABS
50.0000 mg | ORAL_TABLET | Freq: Every evening | ORAL | Status: DC | PRN
  Administered 2012-06-22 – 2012-06-23 (×2): 50 mg via ORAL
  Filled 2012-06-22: qty 3

## 2012-06-22 MED ORDER — RISPERIDONE 0.25 MG PO TABS
0.2500 mg | ORAL_TABLET | Freq: Two times a day (BID) | ORAL | Status: DC
  Administered 2012-06-22 – 2012-06-24 (×5): 0.25 mg via ORAL
  Filled 2012-06-22: qty 6
  Filled 2012-06-22: qty 1
  Filled 2012-06-22: qty 6
  Filled 2012-06-22 (×6): qty 1

## 2012-06-22 MED ORDER — HYDROXYZINE HCL 25 MG PO TABS
25.0000 mg | ORAL_TABLET | Freq: Four times a day (QID) | ORAL | Status: DC | PRN
  Administered 2012-06-22 – 2012-06-23 (×3): 25 mg via ORAL

## 2012-06-22 MED ORDER — ADULT MULTIVITAMIN LIQUID CH
5.0000 mL | Freq: Every day | ORAL | Status: DC
  Filled 2012-06-22 (×4): qty 5

## 2012-06-22 MED ORDER — ALUM & MAG HYDROXIDE-SIMETH 200-200-20 MG/5ML PO SUSP: 30.0000 mL | ORAL | Status: DC | PRN

## 2012-06-22 MED ORDER — ENSURE COMPLETE PO LIQD
237.0000 mL | Freq: Two times a day (BID) | ORAL | Status: DC
  Administered 2012-06-23: 237 mL via ORAL

## 2012-06-22 MED ORDER — MAGNESIUM HYDROXIDE 400 MG/5ML PO SUSP: 30.0000 mL | Freq: Every day | ORAL | Status: DC | PRN

## 2012-06-22 MED ORDER — SERTRALINE HCL 25 MG PO TABS
25.0000 mg | ORAL_TABLET | Freq: Every day | ORAL | Status: DC
  Administered 2012-06-22 – 2012-06-24 (×3): 25 mg via ORAL
  Filled 2012-06-22 (×2): qty 1
  Filled 2012-06-22: qty 3
  Filled 2012-06-22 (×2): qty 1
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
## 2012-06-22 NOTE — Progress Notes (Signed)
[facility] Group Notes:  (Nursing/MHT/Case Management/Adjunct)  Date:  06/22/2012  Time:  11:55 AM  Type of Therapy:  Therapeutic Activity  Participation Level:  Did Not Attend   Dalia Heading 06/22/2012, 11:55 AM
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
## 2012-06-22 NOTE — Progress Notes (Addendum)
Adult Psychoeducational Group Note  Date:  06/22/2012 Time:  10:00 am  Group Topic/Focus:  Recovery Goals:   The focus of this group is to identify appropriate goals for recovery and establish a plan to achieve them.  Participation Level:  Did Not Attend    Eliezer Champagne 06/22/2012, 1:24 PM
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
## 2012-06-22 NOTE — Progress Notes (Signed)
NUTRITION ASSESSMENT  Pt identified as at risk on the Malnutrition Screen Tool  INTERVENTION: 1. Educated patient on the importance of nutrition and encouraged intake of food and beverages. 2. Discussed weight goals. 3. Supplements: Vanilla Ensure bid 4.  MVI daily  NUTRITION DIAGNOSIS: Unintentional weight loss related to sub-optimal intake as evidenced by pt report.   Goal: Pt to meet >/= 90% of their estimated nutrition needs.  Monitor:  PO intake  Assessment:  Patient admitted with depression and suicidal thoughts.  Use of THC daily, ETOH 2-3 x per month, and cocaine occasionally.  Works from home, lives with mom and 2 yo daughter.    Patient is underweight.   Patient reports UBW 90-100 lbs with decrease secondary to stress and sub-optimal intake.  Patient reports "healthy weight" of 105 lbs.  27 y.o. female  Height: Ht Readings from Last 1 Encounters:  06/21/12 5\' 1"  (1.549 m)    Weight: Wt Readings from Last 1 Encounters:  06/21/12 92 lb 8 oz (41.958 kg)    Weight Hx: Wt Readings from Last 10 Encounters:  06/21/12 92 lb 8 oz (41.958 kg)  04/07/12 95 lb 4.8 oz (43.228 kg)  03/30/12 98 lb 3.2 oz (44.543 kg)  02/15/12 100 lb (45.36 kg)  07/17/11 92 lb 3.2 oz (41.822 kg)  02/08/11 93 lb (42.185 kg)    BMI:  Body mass index is 17.49 kg/(m^2). Pt meets criteria for underweight based on current BMI.  Estimated Nutritional Needs: Kcal: 30-35 kcal/kg Protein: > 1 gram protein/kg Fluid: 1 ml/kcal  Diet Order: General Pt is also offered choice of unit snacks mid-morning and mid-afternoon.  Pt is eating as desired.   Lab results and medications reviewed.   Oran Rein, RD, LDN Clinical Inpatient Dietitian Pager:  [phone] Weekend and after hours pager:  [phone]

## 2012-06-22 NOTE — [facility] Group Notes (Signed)
[facility] LCSW Group Therapy  06/22/2012  1:15 PM   Type of Therapy:  Group Therapy  Participation Level:  Active  Participation Quality:  Appropriate and Attentive  Affect:  Appropriate, Flat and Depressed   Cognitive:  Alert and Appropriate  Insight:  Developing/Improving and Engaged  Engagement in Therapy:  Developing/Improving and Engaged  Modes of Intervention:  Clarification, Confrontation, Discussion, Education, Exploration, Limit-setting, Orientation, Problem-solving, Rapport Building, Dance movement psychotherapist, Socialization and Support  Summary of Progress/Problems: The topic for group therapy was feelings about diagnosis.  Pt actively participated in group discussion on their past and current diagnosis and how they feel towards this.  Pt also identified how society and family members judge them, based on their diagnosis as well as stereotypes and stigmas.  Pt shared that she has not been diagnosed yet but is dealing with depressive symptoms.  Pt states that she felt scared and out of control, and felt she had to pretend everything was okay.  Pt was able to process how holding her true feelings in for so long actually hurt her, resulting in this hospitalization.  Pt shared that she feels relieved to be here and to get the help she needs.  Pt actively participated in the group discussion and was actively listening to others.     Tara Thompson, [place] 06/22/2012 2:41 PM

## 2012-06-23 DIAGNOSIS — F3160 Bipolar disorder, current episode mixed, unspecified: Principal | ICD-10-CM

## 2012-06-23 MED ORDER — ADULT MULTIVITAMIN W/MINERALS CH
1.0000 | ORAL_TABLET | Freq: Every day | ORAL | Status: DC
  Administered 2012-06-24: 1 via ORAL
  Filled 2012-06-23 (×3): qty 1

## 2012-06-23 MED ORDER — NICOTINE 21 MG/24HR TD PT24
21.0000 mg | MEDICATED_PATCH | Freq: Every day | TRANSDERMAL | Status: DC
  Administered 2012-06-23 – 2012-06-24 (×2): 21 mg via TRANSDERMAL
  Filled 2012-06-23 (×3): qty 1

## 2012-06-23 NOTE — Progress Notes (Signed)
D: Patient denies SI/HI and auditory and visual hallucinations. The patient has a depressed mood and affect. The patient rates her depression a 5 out of 10 and her hopelessness a 2 out of 10 (1 low/10 high). The patient reports sleeping fairly well and states that her appetite is improving and that her energy level is normal. The patient is having anxiety and is frequently asking RN "what can I have?" The patient is attending groups and is interacting appropriately within the milieu.  A: Patient given emotional support from RN. Patient encouraged to come to staff with concerns and/or questions. Patient's medication routine continued. Patient's orders and plan of care reviewed. Patient given education regarding medications and given PRN vistiril.  R: Patient remains cooperative. Will continue to monitor patient q15 minutes for safety.

## 2012-06-23 NOTE — Progress Notes (Signed)
Adult Psychoeducational Group Note  Date:  06/23/2012 Time:  11:00AM Group Topic/Focus: Mental Health Crisis Crisis Planning:   The purpose of this group is to help patients create a crisis plan for use upon discharge or in the future, as needed.  Participation Level:  Active  Participation Quality:  Appropriate, Attentive and Sharing  Affect:  Appropriate  Cognitive:  Appropriate  Insight: Improving  Engagement in Group:  Engaged   Eliezer Champagne 06/23/2012, 11:44 AM

## 2012-06-23 NOTE — Progress Notes (Signed)
[facility] MD Progress Note  06/23/2012 3:38 PM Tara Thompson  MRN:  [id number] Subjective:  Eknoor reports feeling much better today. She reports sleeping well and starting to eat good. Patient stated "I just had not been taking care of myself." Patient feels like her new medication regimen is helping. Rates her depression at five, which is down from seven. After discharge patient plans to quit smoking and "I want to get FMLA so I can have some time for myself. I haven't been taking any time." Denies having any tearful episodes since yesterday.  Objective: Patient appears much calmer and is well groomed today.  Diagnosis:   Axis I: Bipolar, mixed Axis II: Deferred Axis III:  Past Medical History  Diagnosis Date  . Ovarian cyst   . Abnormal Pap smear     LSIL>normal follow-up pap  . Anxiety   . Mental disorder   . Depression    Axis IV: economic problems, occupational problems and other psychosocial or environmental problems Axis V: 51-60 moderate symptoms  ADL's:  Intact  Sleep: Good  Appetite:  Good  Suicidal Ideation:  Denies Homicidal Ideation:  Denies AEB (as evidenced by):  Psychiatric Specialty Exam: Review of Systems  Constitutional: Negative.   HENT: Negative.   Eyes: Negative.   Respiratory: Negative.   Cardiovascular: Negative.   Gastrointestinal: Negative.   Genitourinary: Negative.   Musculoskeletal: Negative.   Skin: Negative.   Neurological: Negative.   Endo/Heme/Allergies: Negative.   Psychiatric/Behavioral: Positive for depression and substance abuse. Negative for suicidal ideas, hallucinations and memory loss. The patient is nervous/anxious. The patient does not have insomnia.     Blood pressure 92/62, pulse 75, temperature 98.6 F (37 C), temperature source Oral, resp. rate 20, height 5\' 1"  (1.549 m), weight 41.958 kg (92 lb 8 oz), last menstrual period 06/07/2012.Body mass index is 17.49 kg/(m^2).  General Appearance: Casual and Well Groomed  Eye  Contact::  Good  Speech:  Clear and Coherent  Volume:  Normal  Mood:  Euphoric  Affect:  Appropriate  Thought Process:  Goal Directed and Intact  Orientation:  Full (Time, Place, and Person)  Thought Content:  WDL  Suicidal Thoughts:  No  Homicidal Thoughts:  No  Memory:  Immediate;   Good Recent;   Good Remote;   Good  Judgement:  Fair  Insight:  Present  Psychomotor Activity:  Normal  Concentration:  Good  Recall:  Good  Akathisia:  No  Handed:  Right  AIMS (if indicated):     Assets:  Communication Skills Desire for Improvement Housing Intimacy Leisure Time Physical Health Resilience Social Support  Sleep:  Number of Hours: 6.5   Current Medications: Current Facility-Administered Medications  Medication Dose Route Frequency Provider Last Rate Last Dose  . acetaminophen (TYLENOL) tablet 650 mg  650 mg Oral Q6H PRN Cleotis Nipper, MD      . alum & mag hydroxide-simeth (MAALOX/MYLANTA) 200-200-20 MG/5ML suspension 30 mL  30 mL Oral Q4H PRN Cleotis Nipper, MD      . feeding supplement (ENSURE COMPLETE) liquid 237 mL  237 mL Oral BID BM Jeoffrey Massed, RD   237 mL at 06/23/12 1047  . hydrOXYzine (ATARAX/VISTARIL) tablet 25 mg  25 mg Oral Q6H PRN Fransisca Kaufmann, NP   25 mg at 06/23/12 0745  . hydrOXYzine (ATARAX/VISTARIL) tablet 50 mg  50 mg Oral QHS PRN Cleotis Nipper, MD   50 mg at 06/22/12 2132  . magnesium hydroxide (MILK OF MAGNESIA) suspension  30 mL  30 mL Oral Daily PRN Cleotis Nipper, MD      . multivitamin with minerals tablet 1 tablet  1 tablet Oral Daily Himabindu Ravi, MD      . nicotine (NICODERM CQ - dosed in mg/24 hours) patch 21 mg  21 mg Transdermal Q0600 Himabindu Ravi, MD   21 mg at 06/23/12 0818  . risperiDONE (RISPERDAL) tablet 0.25 mg  0.25 mg Oral BID Himabindu Ravi, MD   0.25 mg at 06/23/12 0743  . sertraline (ZOLOFT) tablet 25 mg  25 mg Oral Daily Himabindu Ravi, MD   25 mg at 06/23/12 1610    Lab Results:  Results for orders placed during the hospital  encounter of 06/21/12 (from the past 48 hour(s))  URINE RAPID DRUG SCREEN (HOSP PERFORMED)     Status: Abnormal   Collection Time    06/21/12  6:47 PM      Result Value Range   Opiates NONE DETECTED  NONE DETECTED   Cocaine NONE DETECTED  NONE DETECTED   Benzodiazepines POSITIVE (*) NONE DETECTED   Amphetamines NONE DETECTED  NONE DETECTED   Tetrahydrocannabinol POSITIVE (*) NONE DETECTED   Barbiturates NONE DETECTED  NONE DETECTED   Comment:            DRUG SCREEN FOR MEDICAL PURPOSES     ONLY.  IF CONFIRMATION IS NEEDED     FOR ANY PURPOSE, NOTIFY LAB     WITHIN 5 DAYS.                LOWEST DETECTABLE LIMITS     FOR URINE DRUG SCREEN     Drug Class       Cutoff (ng/mL)     Amphetamine      1000     Barbiturate      200     Benzodiazepine   200     Tricyclics       300     Opiates          300     Cocaine          300     THC              50  ACETAMINOPHEN LEVEL     Status: None   Collection Time    06/21/12  6:50 PM      Result Value Range   Acetaminophen (Tylenol), Serum <15.0  10 - 30 ug/mL   Comment:            THERAPEUTIC CONCENTRATIONS VARY     SIGNIFICANTLY. A RANGE OF 10-30     ug/mL MAY BE AN EFFECTIVE     CONCENTRATION FOR MANY PATIENTS.     HOWEVER, SOME ARE BEST TREATED     AT CONCENTRATIONS OUTSIDE THIS     RANGE.     ACETAMINOPHEN CONCENTRATIONS     >150 ug/mL AT 4 HOURS AFTER     INGESTION AND >50 ug/mL AT 12     HOURS AFTER INGESTION ARE     OFTEN ASSOCIATED WITH TOXIC     REACTIONS.  CBC     Status: Abnormal   Collection Time    06/21/12  6:50 PM      Result Value Range   WBC 12.5 (*) 4.0 - 10.5 K/uL   RBC 4.71  3.87 - 5.11 MIL/uL   Hemoglobin 13.5  12.0 - 15.0 g/dL   HCT 96.0  45.4 - 09.8 %  MCV 85.8  78.0 - 100.0 fL   MCH 28.7  26.0 - 34.0 pg   MCHC 33.4  30.0 - 36.0 g/dL   RDW 09.8  11.9 - 14.7 %   Platelets 388  150 - 400 K/uL  COMPREHENSIVE METABOLIC PANEL     Status: Abnormal   Collection Time    06/21/12  6:50 PM      Result  Value Range   Sodium 137  135 - 145 mEq/L   Potassium 3.6  3.5 - 5.1 mEq/L   Chloride 101  96 - 112 mEq/L   CO2 25  19 - 32 mEq/L   Glucose, Bld 107 (*) 70 - 99 mg/dL   BUN 9  6 - 23 mg/dL   Creatinine, Ser 8.29  0.50 - 1.10 mg/dL   Calcium 9.2  8.4 - 56.2 mg/dL   Total Protein 7.4  6.0 - 8.3 g/dL   Albumin 4.5  3.5 - 5.2 g/dL   AST 16  0 - 37 U/L   ALT 6  0 - 35 U/L   Alkaline Phosphatase 79  39 - 117 U/L   Total Bilirubin 0.6  0.3 - 1.2 mg/dL   GFR calc non Af Amer >90  >90 mL/min   GFR calc Af Amer >90  >90 mL/min   Comment:            The eGFR has been calculated     using the CKD EPI equation.     This calculation has not been     validated in all clinical     situations.     eGFR's persistently     <90 mL/min signify     possible Chronic Kidney Disease.  ETHANOL     Status: None   Collection Time    06/21/12  6:50 PM      Result Value Range   Alcohol, Ethyl (B) <11  0 - 11 mg/dL   Comment:            LOWEST DETECTABLE LIMIT FOR     SERUM ALCOHOL IS 11 mg/dL     FOR MEDICAL PURPOSES ONLY  SALICYLATE LEVEL     Status: Abnormal   Collection Time    06/21/12  6:50 PM      Result Value Range   Salicylate Lvl <2.0 (*) 2.8 - 20.0 mg/dL    Physical Findings: AIMS: Facial and Oral Movements Muscles of Facial Expression: None, normal Lips and Perioral Area: None, normal Jaw: None, normal Tongue: None, normal,Extremity Movements Upper (arms, wrists, hands, fingers): None, normal Lower (legs, knees, ankles, toes): None, normal, Trunk Movements Neck, shoulders, hips: None, normal, Overall Severity Severity of abnormal movements (highest score from questions above): None, normal Incapacitation due to abnormal movements: None, normal Patient's awareness of abnormal movements (rate only patient's report): No Awareness, Dental Status Current problems with teeth and/or dentures?: No Does patient usually wear dentures?: No  CIWA:    COWS:     Treatment Plan  Summary: Daily contact with patient to assess and evaluate symptoms and progress in treatment Medication management  Plan: Continue crisis management and stabilization.  Medication management: Continue current regimen. Medication reviewed with patient and reports no untoward effects.   Encouraged patient to attend groups and participate in group counseling sessions and activities.  Discharge plan in progress.  Address health issues: Vital reviewed and stable. Anticipate d/c in 1-2 days.  Continue current treatment plan.   Medical Decision Making Problem Points:  Established problem, stable/improving (1) and Review of psycho-social stressors (1) Data Points:  Review of medication regiment & side effects (2)  I certify that inpatient services furnished can reasonably be expected to improve the patient's condition.   Charmian Forbis NP-C 06/23/2012, 3:38 PM

## 2012-06-23 NOTE — Progress Notes (Signed)
Adult Psychoeducational Group Note  Date:  06/23/2012 Time:  10:55 PM  Group Topic/Focus:  Goals Group:   The focus of this group is to help patients establish daily goals to achieve during treatment and discuss how the patient can incorporate goal setting into their daily lives to aide in recovery.  Participation Level:  Active  Participation Quality:  Appropriate  Affect:  Appropriate  Cognitive:  Appropriate  Insight: Appropriate  Engagement in Group:  Engaged  Modes of Intervention:  Discussion  Additional Comments:  Pt. Was able to express why she was here and give triggers and solutions to her goal. Pt. Was able to  Explain how she plans to use these goals when she leaves.  Terie Purser R 06/23/2012, 10:55 PM

## 2012-06-23 NOTE — Tx Team (Signed)
Interdisciplinary Treatment Plan Update (Adult)  Date: 06/23/2012  Time Reviewed:  9:45 AM  Progress in Treatment: Attending groups: Yes Participating in groups:  Yes Taking medication as prescribed:  Yes Tolerating medication:  Yes Family/Significant othe contact made: Pt gave consent to talk with parents Patient understands diagnosis:  Yes Discussing patient identified problems/goals with staff:  Yes Medical problems stabilized or resolved:  Yes Denies suicidal/homicidal ideation: Yes Issues/concerns per patient self-inventory:  Yes Other:  New problem(s) identified: N/A  Discharge Plan or Barriers: CSW assessing for appropriate referrals.    Reason for Continuation of Hospitalization: Anxiety Depression Medication Stabilization  Comments: N/A  Estimated length of stay: 3-5 days  For review of initial/current patient goals, please see plan of care.  Attendees: Patient:     Family:     Physician:   06/23/2012 8:08 AM   Nursing:   Nestor Ramp, RN 06/23/2012 8:08 AM   Clinical Social Worker:  Reyes Ivan, LCSWA 06/23/2012 8:08 AM   Other: Orlene Erm, LCSW 06/23/2012 8:08 AM   Other:  Berneice Heinrich, RN 06/23/2012 10:14 AM   Other:  Fransisca Kaufmann, NP 06/23/2012 10:14 AM   Other:  Lizabeth Leyden, RN 06/23/2012 10:14 AM   Other:    Other:    Other:    Other:    Other:    Other:     Scribe for Treatment Team:   Carmina Miller, 06/23/2012 8:08 AM
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
## 2012-06-23 NOTE — [facility] Group Notes (Signed)
[facility] LCSW Aftercare Discharge Planning Group Note   06/23/2012 8:45 AM  Participation Quality:  Alert and Appropriate   Mood/Affect:  Appropriate, Anxious  Depression Rating:  6  Anxiety Rating:  6  Thoughts of Suicide:  Pt denies SI/HI  Will you contract for safety?   Yes  Current AVH:  No  Plan for Discharge/Comments:  Pt attended discharge planning group and actively participated in group.  CSW provided pt with today's workbook.  Pt presents with anxious mood and affect. Pt states that she is very worried about her job.  Pt states that she wants to get 4 weeks of FMLA.  CSW stated that CSW would help pt with calling HR today.  CSW will assess for appropriate referrals.  No further needs voiced by pt at this time.     Transportation Means: Yes  Supports: Parents are supportive  Tara Thompson, LCSWA 06/23/2012 10:10 AM

## 2012-06-23 NOTE — Clinical Social Work Note (Signed)
CSW contacted pt's employer, Mariane Baumgarten at [employer] on this date [phone]).  CSW informed him that pt was in the hospital. Consent in the chart.   CSW than contacted [employer]let[employer] [phone]) to inquire about FMLA paperwork.  CSW started pt's claim for disability, per pt's request.  Claim # is [id number].    Reyes Ivan, LCSWA 06/23/2012  11:52 AM

## 2012-06-23 NOTE — Progress Notes (Signed)
Recreation Therapy Notes  Date: 05.14.2014 Time: 3:00pm Location: [address] Dayroom      Group Topic/Focus: Decision Making  Participation Level: Active  Participation Quality: Appropriate, Attentive and Redirectable  Affect: Euthymic  Cognitive: Appropriate   Additional Comments: Activity: [place] ; Explanation: Patients were informed they have been exiled to a deserted [place], essential items are provided for them. Patients were asked to identify 1 piece of music, 1 book, not a bible, and 1 luxury item, not a boat to leave [address] they would like to take with them.   Patient actively participated in group activity. Patient identified the following items. Law book, iPod, Cigarettes. Patient stated she made decisions logically as well as based off of emotion. Patient participated in wrap up discussion about making positive healthy decisions post d/c. Patient needed prompt to stop side conversation with peer. Patient complied with LRT request. Patient shared that her family causes a lot of anxiety in her life. Patient stated she often does not know how to handler her family. LRT encouraged patient to learn signs that she is reaching her max, good coping mechanisms for dealing with her family, as well as how to set good boundaries with her family members during admission to [facility].   Marykay Lex Shakiara Lukic, LRT/CTRS  Jearl Klinefelter 06/23/2012 4:44 PM

## 2012-06-23 NOTE — [facility] Suicide Risk Assessment (Signed)
[facility] INPATIENT:  Family/Significant Other Suicide Prevention Education  Suicide Prevention Education:  Education Completed; Buena Irish - mother [phone]),  (name of family member/significant other) has been identified by the patient as the family member/significant other with whom the patient will be residing, and identified as the person(s) who will aid the patient in the event of a mental health crisis (suicidal ideations/suicide attempt).  With written consent from the patient, the family member/significant other has been provided the following suicide prevention education, prior to the and/or following the discharge of the patient.  The suicide prevention education provided includes the following:  Suicide risk factors  Suicide prevention and interventions  National Suicide Hotline telephone number  [facility] assessment telephone number  [facility] Emergency Assistance 911  [facility] and/or Residential Mobile Crisis Unit telephone number  Request made of family/significant other to:  Remove weapons (e.g., guns, rifles, knives), all items previously/currently identified as safety concern.    Remove drugs/medications (over-the-counter, prescriptions, illicit drugs), all items previously/currently identified as a safety concern.  The family member/significant other verbalizes understanding of the suicide prevention education information provided.  The family member/significant other agrees to remove the items of safety concern listed above.  Carmina Miller 06/23/2012, 11:23 AM

## 2012-06-24 DIAGNOSIS — F329 Major depressive disorder, single episode, unspecified: Secondary | ICD-10-CM

## 2012-06-24 MED ORDER — HYDROXYZINE HCL 50 MG PO TABS: 50.0000 mg | ORAL_TABLET | Freq: Every evening | ORAL | Status: DC | PRN

## 2012-06-24 MED ORDER — RISPERIDONE 0.25 MG PO TABS: 0.2500 mg | ORAL_TABLET | Freq: Two times a day (BID) | ORAL | Status: DC

## 2012-06-24 MED ORDER — SERTRALINE HCL 25 MG PO TABS: 25.0000 mg | ORAL_TABLET | Freq: Every day | ORAL | Status: DC

## 2012-06-24 NOTE — Discharge Summary (Signed)
Physician Discharge Summary Note  Patient:  Tara Thompson is an 27 y.o., female MRN:  [id number] DOB:  Feb 16, 1985 Patient phone:  [phone] (home)  Patient address:   [address] [place] [address],   Date of Admission:  06/21/2012 Date of Discharge: 06/24/12  Reason for Admission:  Depression with SI  Discharge Diagnoses: Active Problems:   * No active hospital problems. *  Review of Systems  Constitutional: Negative.   HENT: Negative.   Eyes: Negative.   Respiratory: Negative.   Cardiovascular: Negative.   Gastrointestinal: Negative.   Genitourinary: Negative.   Musculoskeletal: Negative.   Skin: Negative.   Neurological: Negative.   Endo/Heme/Allergies: Negative.   Psychiatric/Behavioral: Negative for depression, suicidal ideas, hallucinations, memory loss and substance abuse. The patient is not nervous/anxious and does not have insomnia.    Axis Diagnosis:   AXIS I:  Bipolar, mixed AXIS II:  Deferred AXIS III:   Past Medical History  Diagnosis Date  . Ovarian cyst   . Abnormal Pap smear     LSIL>normal follow-up pap  . Anxiety   . Mental disorder   . Depression    AXIS IV:  economic problems, occupational problems and other psychosocial or environmental problems AXIS V:  61-70 mild symptoms  Level of Care:  OP  Hospital Course:  Tara Thompson is an 27 y.o. female. Pt presented to [facility] with C/O increased depression, suicidal ideations and anxiety. Pt reports that she is to the point where she wants to put a gun to her head . Pt presents extremely anxious,tearful,and distraught. Pt reports that she is scared and embarrassed. Pt reports that she is overwhelmed about her employer and job duties and is fearful that she is going to lose her job. Pt reports that she feels "sick" and nauseated due to her level of anxiety and depression. Pt reports stressors to include problems with relationships, wanting to be a good mom to her daughter, and occupational  stress. Pt has difficulty during her assessment of staying on task and answering questions and had to be redirected several times. Pt observed demonstrating a wide range of emotions during assessment, pt is hysterically crying one moment and laughing,smiling, and cursing the next. Pt having difficulty regulating her emotions. Pt states " i am paranoid as fuck" and " i think people are following me sometimes". Pt continues to ask assessor " is this a safe place because I am scared" assessor continuously reassured pt that she was in a safe place and no one was going to harm her.       The duration of stay was three days.The patient was seen and evaluated by the Treatment team consisting of Psychiatrist, NP-C, RN, Case Manager, and Therapist for evaluation and treatment plan with goal of stabilization upon discharge. The patient's physical and mental health problems were identified and treated appropriately.      Multiple modalities of treatment were used including medication, individual and group therapies, unit programming, improved nutrition, physical activity, and family sessions as needed. The patient was on no prior to admission medications. The patient's mood was very labile on the first day of admission as patient was noted to be very tearful. Tara Thompson was started on Risperdal 0.25 mg po bid for mood stability and Zoloft 25 mg daily for depression. Patient reported that she had stopped taking medications in the past and had slowly slipped back into a depression. The patient stated "I do not think I was ever in the  right medications. I feel like this combination is helping."      The symptoms of depression were monitored daily by evaluation by clinical provider.  The patient's mental and emotional status was evaluated by a daily self inventory completed by the patient.      Improvement was demonstrated by declining numbers on the self assessment, improving vital signs, increased cognition, and improvement in  mood, sleep, appetite as well as a reduction in physical symptoms. The patient responded well to taking Vistaril 50 mg for sleep and reported to staff that sleeping well had really helped to improve her mood. Patient also appeared more stable when speaking with staff with no episodes of outbursts or starting to cry for no reason.      The patient was evaluated and found to be stable enough for discharge and was released to home per the initial plan of treatment. Tara Thompson denied any SI on the day of discharge. She talked about wanting to take better care of herself and possibly take some time off from work. Patient received samples of her medications and also a three day supply. Tara Thompson was deemed physically and mentally stable for discharge. Also patient appeared very invested in following up with her aftercare appointments.   Mental Status Exam:  For mental status exam please see mental status exam and  suicide risk assessment completed by attending physician prior to discharge.   Consults:  None  Significant Diagnostic Studies:  labs: CBC, chem profile, UDS reviewed and stable  Discharge Vitals:   Blood pressure 108/75, pulse 94, temperature 98.5 F (36.9 C), temperature source Oral, resp. rate 18, height 5\' 1"  (1.549 m), weight 41.958 kg (92 lb 8 oz), last menstrual period 06/07/2012. Body mass index is 17.49 kg/(m^2). Lab Results:   Results for orders placed during the hospital encounter of 06/21/12 (from the past 72 hour(s))  URINE RAPID DRUG SCREEN (HOSP PERFORMED)     Status: Abnormal   Collection Time    06/21/12  6:47 PM      Result Value Range   Opiates NONE DETECTED  NONE DETECTED   Cocaine NONE DETECTED  NONE DETECTED   Benzodiazepines POSITIVE (*) NONE DETECTED   Amphetamines NONE DETECTED  NONE DETECTED   Tetrahydrocannabinol POSITIVE (*) NONE DETECTED   Barbiturates NONE DETECTED  NONE DETECTED   Comment:            DRUG SCREEN FOR MEDICAL PURPOSES     ONLY.  IF CONFIRMATION  IS NEEDED     FOR ANY PURPOSE, NOTIFY LAB     WITHIN 5 DAYS.                LOWEST DETECTABLE LIMITS     FOR URINE DRUG SCREEN     Drug Class       Cutoff (ng/mL)     Amphetamine      1000     Barbiturate      200     Benzodiazepine   200     Tricyclics       300     Opiates          300     Cocaine          300     THC              50  ACETAMINOPHEN LEVEL     Status: None   Collection Time    06/21/12  6:50 PM  Result Value Range   Acetaminophen (Tylenol), Serum <15.0  10 - 30 ug/mL   Comment:            THERAPEUTIC CONCENTRATIONS VARY     SIGNIFICANTLY. A RANGE OF 10-30     ug/mL MAY BE AN EFFECTIVE     CONCENTRATION FOR MANY PATIENTS.     HOWEVER, SOME ARE BEST TREATED     AT CONCENTRATIONS OUTSIDE THIS     RANGE.     ACETAMINOPHEN CONCENTRATIONS     >150 ug/mL AT 4 HOURS AFTER     INGESTION AND >50 ug/mL AT 12     HOURS AFTER INGESTION ARE     OFTEN ASSOCIATED WITH TOXIC     REACTIONS.  CBC     Status: Abnormal   Collection Time    06/21/12  6:50 PM      Result Value Range   WBC 12.5 (*) 4.0 - 10.5 K/uL   RBC 4.71  3.87 - 5.11 MIL/uL   Hemoglobin 13.5  12.0 - 15.0 g/dL   HCT 11.9  14.7 - 82.9 %   MCV 85.8  78.0 - 100.0 fL   MCH 28.7  26.0 - 34.0 pg   MCHC 33.4  30.0 - 36.0 g/dL   RDW 56.2  13.0 - 86.5 %   Platelets 388  150 - 400 K/uL  COMPREHENSIVE METABOLIC PANEL     Status: Abnormal   Collection Time    06/21/12  6:50 PM      Result Value Range   Sodium 137  135 - 145 mEq/L   Potassium 3.6  3.5 - 5.1 mEq/L   Chloride 101  96 - 112 mEq/L   CO2 25  19 - 32 mEq/L   Glucose, Bld 107 (*) 70 - 99 mg/dL   BUN 9  6 - 23 mg/dL   Creatinine, Ser 7.84  0.50 - 1.10 mg/dL   Calcium 9.2  8.4 - 69.6 mg/dL   Total Protein 7.4  6.0 - 8.3 g/dL   Albumin 4.5  3.5 - 5.2 g/dL   AST 16  0 - 37 U/L   ALT 6  0 - 35 U/L   Alkaline Phosphatase 79  39 - 117 U/L   Total Bilirubin 0.6  0.3 - 1.2 mg/dL   GFR calc non Af Amer >90  >90 mL/min   GFR calc Af Amer >90  >90  mL/min   Comment:            The eGFR has been calculated     using the CKD EPI equation.     This calculation has not been     validated in all clinical     situations.     eGFR's persistently     <90 mL/min signify     possible Chronic Kidney Disease.  ETHANOL     Status: None   Collection Time    06/21/12  6:50 PM      Result Value Range   Alcohol, Ethyl (B) <11  0 - 11 mg/dL   Comment:            LOWEST DETECTABLE LIMIT FOR     SERUM ALCOHOL IS 11 mg/dL     FOR MEDICAL PURPOSES ONLY  SALICYLATE LEVEL     Status: Abnormal   Collection Time    06/21/12  6:50 PM      Result Value Range   Salicylate Lvl <2.0 (*) 2.8 - 20.0 mg/dL  Physical Findings: AIMS: Facial and Oral Movements Muscles of Facial Expression: None, normal Lips and Perioral Area: None, normal Jaw: None, normal Tongue: None, normal,Extremity Movements Upper (arms, wrists, hands, fingers): None, normal Lower (legs, knees, ankles, toes): None, normal, Trunk Movements Neck, shoulders, hips: None, normal, Overall Severity Severity of abnormal movements (highest score from questions above): None, normal Incapacitation due to abnormal movements: None, normal Patient's awareness of abnormal movements (rate only patient's report): No Awareness, Dental Status Current problems with teeth and/or dentures?: No Does patient usually wear dentures?: No  CIWA:    COWS:     Psychiatric Specialty Exam: See Psychiatric Specialty Exam and Suicide Risk Assessment completed by Attending Physician prior to discharge.  Discharge destination:  Home  Is patient on multiple antipsychotic therapies at discharge:  No   Has Patient had three or more failed trials of antipsychotic monotherapy by history:  No  Recommended Plan for Multiple Antipsychotic Therapies: N/A  Discharge Orders   Future Orders Complete By Expires     Activity as tolerated - No restrictions  As directed         Medication List    TAKE these  medications     Indication   hydrOXYzine 50 MG tablet  Commonly known as:  ATARAX/VISTARIL  Take 1 tablet (50 mg total) by mouth at bedtime as needed (sleep).   Indication:  Sedation     risperiDONE 0.25 MG tablet  Commonly known as:  RISPERDAL  Take 1 tablet (0.25 mg total) by mouth 2 (two) times daily. For mood control.   Indication:  Easily Angered or Annoyed     sertraline 25 MG tablet  Commonly known as:  ZOLOFT  Take 1 tablet (25 mg total) by mouth daily. For depression   Indication:  Major Depressive Disorder           Follow-up Information   Follow up with [facility] On 07/13/2012. (Appointment scheduled at 1:30 pm with Shaaron Adler for therapy.  They will set you up for medication management at this appointment)    Contact information:   [address]  [place], [place] [address] Phone: [phone] Fax: [phone]      Follow-up recommendations:  Activity:  Resume usual activities Diet:  Regular  Comments:   Take all your medications as prescribed by your mental healthcare provider.  Report any adverse effects and or reactions from your medicines to your outpatient provider promptly.  Patient is instructed and cautioned to not engage in alcohol and or illegal drug use while on prescription medicines.  In the event of worsening symptoms, patient is instructed to call the crisis hotline, 911 and or go to the nearest ED for appropriate evaluation and treatment of symptoms.  Follow-up with your primary care provider for your other medical issues, concerns and or health care needs.    Total Discharge Time:  Greater than 30 minutes.  SignedFransisca Kaufmann NP-C 06/24/2012, 9:42 AM

## 2012-06-24 NOTE — BHH Group Notes (Signed)
[facility] LCSW Aftercare Discharge Planning Group Note   06/24/2012 8:45 AM  Participation Quality:  Alert and Appropriate   Mood/Affect:  Appropriate and Calm  Depression Rating:  0  Anxiety Rating:  4  Thoughts of Suicide:  Pt denies SI/HI  Will you contract for safety?   Yes  Current AVH:  No  Plan for Discharge/Comments:  Pt attended discharge planning group and actively participated in group.  CSW provided pt with today's workbook.  Pt presents much calmer and stable than previous days today.  Pt states that she feels more stable today and that meds are helping.  Pt states that she is anxious about her job and filing for disability.  CSW reassured pt that CSW spoke with her boss and opened the claim so pt shouldn't worry at this time.  Pt has follow up scheduled at [facility] for medication management and therapy.  No further needs voiced by pt at this time.    Transportation Means: Yes  Supports: Parents are supportive  Reyes Ivan, LCSWA 06/24/2012 9:24 AM

## 2012-06-24 NOTE — Progress Notes (Signed)
Pt discharged per MD orders; pt currently denies SI/HI and auditory/visual hallucinations; pt was given education by RN regarding follow-up appointments and medications and pt denied any questions or concerns about these instructions; pt was then escorted to search room to retrieve her belongings by RN before being discharged to hospital lobby. 

## 2012-06-24 NOTE — Progress Notes (Signed)
Adult Psychoeducational Group Note  Date:  06/24/2012 Time:  12:41 PM  Group Topic/Focus:  Self Care:   The focus of this group is to help patients understand the importance of self-care in order to improve or restore emotional, physical, spiritual, interpersonal, and financial health.  Participation Level:  Active  Participation Quality:  Monopolizing  Affect:  Appropriate  Cognitive:  Alert and Appropriate  Insight: Improving  Engagement in Group:  Monopolizing  Modes of Intervention:  Discussion and Support  Additional Comments:  Patient monopolized speaking time in group. Patient had to be redirected toward describing stressors in her life and ways to cope with stress.  Nestor Ramp [facility] 06/24/2012, 12:41 PM

## 2012-06-24 NOTE — BHH Suicide Risk Assessment (Signed)
Suicide Risk Assessment  Discharge Assessment     Demographic Factors:  Female, caucasian, single  Mental Status Per Nursing Assessment::   On Admission:  NA  Current Mental Status by Physician: Patient alert and oriented to 4. Denies aH/Vh/SI/HI.  Loss Factors: NA  Historical Factors: Impulsivity  Risk Reduction Factors:   Sense of responsibility to family, Employed, Living with another person, especially a relative, Positive social support and Positive coping skills or problem solving skills  Continued Clinical Symptoms:  Depression:   Recent sense of peace/wellbeing  Cognitive Features That Contribute To Risk:  Cognitively intact    Suicide Risk:  Minimal: No identifiable suicidal ideation.  Patients presenting with no risk factors but with morbid ruminations; may be classified as minimal risk based on the severity of the depressive symptoms  Discharge Diagnoses:   AXIS I:  Major Depression, Recurrent severe AXIS II:  No diagnosis AXIS III:   Past Medical History  Diagnosis Date  . Ovarian cyst   . Abnormal Pap smear     LSIL>normal follow-up pap  . Anxiety   . Mental disorder   . Depression    AXIS IV:  other psychosocial or environmental problems AXIS V:  61-70 mild symptoms  Plan Of Care/Follow-up recommendations:  Activity:  As tolerated Diet:  regular Follow up with outpatient appointments.  Is patient on multiple antipsychotic therapies at discharge:  No   Has Patient had three or more failed trials of antipsychotic monotherapy by history:  No  Recommended Plan for Multiple Antipsychotic Therapies: NA  Tara Thompson 06/24/2012, 9:32 AM

## 2012-06-24 NOTE — Progress Notes (Signed)
[facility] Adult Case Management Discharge Plan :  Will you be returning to the same living situation after discharge: Yes,  returning home At discharge, do you have transportation home?:Yes,  family will pick pt up Do you have the ability to pay for your medications:Yes,  access to meds  Release of information consent forms completed and in the chart;  Patient's signature needed at discharge.  Patient to Follow up at: Follow-up Information   Follow up with [facility] On 07/13/2012. (Appointment scheduled at 1:30 pm with Shaaron Adler for therapy.  They will set you up for medication management at this appointment)    Contact information:   [address]  [place], [place] [address] Phone: [phone] Fax: [phone]      Patient denies SI/HI:   Yes,  denies SI/HI    Safety Planning and Suicide Prevention discussed:  Yes,  discussed with pt's mother and pt (see suicide prevention note)  Horton, Salome Arnt 06/24/2012, 10:11 AM

## 2012-06-24 NOTE — Progress Notes (Signed)
D: Patient denies SI/HI/AVH. Patient rates hopelessness as 0,  depression as 3, and anxiety as 2.  Patient affect and mood are anxious.  Pt states "My meds are helping.  I was overly stressed with my job and personal life.  As long as I stay on my meds I will be fine."  Patient did attend evening group. Patient visible on the milieu. No distress noted. A: Support and encouragement offered. Scheduled medications given to pt. Q 15 min checks continued for patient safety. R: Patient receptive. Patient remains safe on the unit.

## 2012-06-26 ENCOUNTER — Ambulatory Visit (HOSPITAL_COMMUNITY)
Admission: AD | Admit: 2012-06-26 | Discharge: 2012-06-26 | Disposition: A | Discharge status: Home / Self Care | Payer: 59 | Attending: Psychiatry | Admitting: Psychiatry

## 2012-06-26 ENCOUNTER — Emergency Department (HOSPITAL_COMMUNITY)
Admission: EM | Admit: 2012-06-26 | Discharge: 2012-06-28 | Disposition: A | Discharge status: Other Acute Inpatient Hospital | Payer: 59 | Attending: Emergency Medicine | Admitting: Emergency Medicine

## 2012-06-26 ENCOUNTER — Other Ambulatory Visit: Payer: Self-pay

## 2012-06-26 ENCOUNTER — Encounter (HOSPITAL_COMMUNITY): Payer: Self-pay | Admitting: Family Medicine

## 2012-06-26 DIAGNOSIS — F319 Bipolar disorder, unspecified: Secondary | ICD-10-CM

## 2012-06-26 DIAGNOSIS — F17200 Nicotine dependence, unspecified, uncomplicated: Secondary | ICD-10-CM | POA: Insufficient documentation

## 2012-06-26 DIAGNOSIS — Z3202 Encounter for pregnancy test, result negative: Secondary | ICD-10-CM | POA: Insufficient documentation

## 2012-06-26 DIAGNOSIS — Z79899 Other long term (current) drug therapy: Secondary | ICD-10-CM | POA: Insufficient documentation

## 2012-06-26 DIAGNOSIS — S298XXA Other specified injuries of thorax, initial encounter: Secondary | ICD-10-CM | POA: Insufficient documentation

## 2012-06-26 DIAGNOSIS — F411 Generalized anxiety disorder: Secondary | ICD-10-CM | POA: Insufficient documentation

## 2012-06-26 DIAGNOSIS — Z8742 Personal history of other diseases of the female genital tract: Secondary | ICD-10-CM | POA: Insufficient documentation

## 2012-06-26 LAB — URINALYSIS, ROUTINE W REFLEX MICROSCOPIC
Nitrite: NEGATIVE
Specific Gravity, Urine: 1.031 — ABNORMAL HIGH (ref 1.005–1.030)
pH: 5.5 (ref 5.0–8.0)

## 2012-06-26 LAB — URINE MICROSCOPIC-ADD ON

## 2012-06-26 LAB — COMPREHENSIVE METABOLIC PANEL
BUN: 12 mg/dL (ref 6–23)
CO2: 24 mEq/L (ref 19–32)
Chloride: 99 mEq/L (ref 96–112)
Creatinine, Ser: 0.58 mg/dL (ref 0.50–1.10)
GFR calc non Af Amer: >90 mL/min (ref >90)
Total Bilirubin: 1.1 mg/dL (ref 0.3–1.2)

## 2012-06-26 LAB — CBC
MCV: 84.2 fL (ref 78.0–100.0)
Platelets: 411 10*3/uL — ABNORMAL HIGH (ref 150–400)
RBC: 5.06 MIL/uL (ref 3.87–5.11)
WBC: 12.5 10*3/uL — ABNORMAL HIGH (ref 4.0–10.5)

## 2012-06-26 LAB — RAPID URINE DRUG SCREEN, HOSP PERFORMED
Barbiturates: NOT DETECTED
Benzodiazepines: NOT DETECTED
Cocaine: NOT DETECTED

## 2012-06-26 MED ORDER — ZOLPIDEM TARTRATE 5 MG PO TABS
5.0000 mg | ORAL_TABLET | Freq: Every evening | ORAL | Status: DC | PRN
  Administered 2012-06-27: 5 mg via ORAL
  Filled 2012-06-26: qty 1

## 2012-06-26 MED ORDER — ACETAMINOPHEN 325 MG PO TABS: 650.0000 mg | ORAL_TABLET | ORAL | Status: DC | PRN

## 2012-06-26 MED ORDER — IBUPROFEN 600 MG PO TABS: 600.0000 mg | ORAL_TABLET | Freq: Three times a day (TID) | ORAL | Status: DC | PRN

## 2012-06-26 MED ORDER — ONDANSETRON HCL 4 MG PO TABS: 4.0000 mg | ORAL_TABLET | Freq: Three times a day (TID) | ORAL | Status: DC | PRN

## 2012-06-26 MED ORDER — LORAZEPAM 1 MG PO TABS
1.0000 mg | ORAL_TABLET | Freq: Three times a day (TID) | ORAL | Status: DC | PRN
  Administered 2012-06-27 (×2): 1 mg via ORAL
  Filled 2012-06-26 (×2): qty 1

## 2012-06-26 MED ORDER — NICOTINE 21 MG/24HR TD PT24: 21.0000 mg | MEDICATED_PATCH | Freq: Every day | TRANSDERMAL | Status: DC
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
## 2012-06-26 NOTE — ED Notes (Signed)
Present to Psych ED, IVC'd/  Pt reports being in [facility] "a few days ago"  Pt reports hx AH.  Pt deneis SI/AH/HI,  Pt calm and cooperatinve.  Pt asked about todays episode with father but pt not respond but just stares.   Pt aaox3.  Will continue to monitor
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
## 2012-06-26 NOTE — ED Notes (Signed)
Patient presents from [facility] after having pre-assessment done. Per transfer hand-off form, patient was c/o chest pain and diarrhea causing her to be incontinent of stool. Patient was discharged from [facility] 3 days ago. Per assessment form, patient appears psychotic and paranoid.

## 2012-06-27 MED ORDER — HYDROXYZINE HCL 25 MG PO TABS: 50.0000 mg | ORAL_TABLET | Freq: Every evening | ORAL | Status: DC | PRN

## 2012-06-27 MED ORDER — SERTRALINE HCL 50 MG PO TABS
25.0000 mg | ORAL_TABLET | Freq: Every day | ORAL | Status: DC
  Administered 2012-06-27: 25 mg via ORAL
  Filled 2012-06-27: qty 1

## 2012-06-27 MED ORDER — RISPERIDONE 0.5 MG PO TABS
0.2500 mg | ORAL_TABLET | Freq: Two times a day (BID) | ORAL | Status: DC
  Administered 2012-06-27 (×2): 0.25 mg via ORAL
  Filled 2012-06-27 (×2): qty 1

## 2012-06-27 NOTE — ED Notes (Signed)
Calmer,sitting quietly on the bed

## 2012-06-27 NOTE — ED Notes (Signed)
Dr Pickering in to see 

## 2012-06-27 NOTE — ED Notes (Signed)
Sleeping, resp even and unlabored

## 2012-06-27 NOTE — ED Notes (Signed)
Patient taking a shower; no s/s of distress noted. Linen changed, new scrubs given.

## 2012-06-27 NOTE — ED Notes (Signed)
Up to the desk on the phone 

## 2012-06-27 NOTE — ED Provider Notes (Signed)
No complaints last night. Patient states she's feeling somewhat better. Pending review at [facility].  [facility]. Tara Payor, MD 06/27/12 [phone]
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
## 2012-06-27 NOTE — ED Notes (Signed)
Pt currently asleep.

## 2012-06-27 NOTE — BHH Counselor (Signed)
Writer called Christiane Ha at [facility] to check on pt's referral.Jonathan says they don't have a referral to pt despite writer's receipt of fax confirmation to [facility] 06/27/12 at 0400. Writer faxed referral again 2030.  Evette Cristal, [place] Assessment Counselor

## 2012-06-27 NOTE — ED Notes (Addendum)
Sitting on the bed, talking loudly, laughing inappropriatly, and reports she feels like she's getting manic.  Pt also asked it there was " anything sharp...I Wouldn't want to hurt anybody."  Pt also reports that she thinks she could hurt someone.

## 2012-06-27 NOTE — ED Notes (Signed)
Calmer, still restless and yells out at times.

## 2012-06-28 NOTE — ED Notes (Signed)
Sheriff transported pt.to [facility].  Pt.'s mother took her belongings home last night.  Sheriff took 4 copies of IVC papers.

## 2012-06-28 NOTE — BHH Counselor (Signed)
Pt has been accepted to [facility] by Dr. Elby Showers. # to call report [phone]. Pt can be transported anytime after 7 am.  Evette Cristal, [place] Assessment Counselor

## 2012-06-28 NOTE — ED Notes (Signed)
Sheriff called, will be here around 0930 to get pt.

## 2012-06-28 NOTE — ED Notes (Signed)
Contacted [place] Sheriff's ofc for transportation to [facility].

## 2012-06-29 NOTE — Progress Notes (Signed)
Patient Discharge Instructions:  After Visit Summary (AVS):   Faxed to:  06/29/12 Discharge Summary Note:   Faxed to:  06/29/12 Psychiatric Admission Assessment Note:   Faxed to:  06/29/12 Suicide Risk Assessment - Discharge Assessment:   Faxed to:  06/29/12 Faxed/Sent to the Next Level Care provider:  06/29/12 Faxed to [facility] @ [phone]  Jerelene Redden, 06/29/2012, 3:45 PM

## 2012-08-25 ENCOUNTER — Other Ambulatory Visit (HOSPITAL_COMMUNITY): Payer: 59 | Attending: Psychiatry | Admitting: Psychiatry

## 2012-08-25 ENCOUNTER — Encounter (HOSPITAL_COMMUNITY): Payer: Self-pay

## 2012-08-25 DIAGNOSIS — F3162 Bipolar disorder, current episode mixed, moderate: Secondary | ICD-10-CM

## 2012-08-25 DIAGNOSIS — F329 Major depressive disorder, single episode, unspecified: Secondary | ICD-10-CM

## 2012-08-25 DIAGNOSIS — F3160 Bipolar disorder, current episode mixed, unspecified: Secondary | ICD-10-CM | POA: Insufficient documentation

## 2012-08-25 DIAGNOSIS — F1220 Cannabis dependence, uncomplicated: Secondary | ICD-10-CM | POA: Insufficient documentation
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
## 2012-08-25 NOTE — Progress Notes (Signed)
    Daily Group Progress Note  Program: IOP  Group Time: 9:00-10:30 am   Participation Level: Active  Behavioral Response: Appropriate  Type of Therapy:  Process Group  Summary of Progress: Today was Pts first day in the IOP program. Pt required redirection to stay on topic and allow others time to share. Pt was focused on her depression symptoms and described feeling overwhelmed. She was encouraged with this being her first time in this group to observe to manage her strong feelings of depression and to learn the culture of the group.      Group Time: 10:30 am - 12:00 pm   Participation Level:  Active  Behavioral Response: Appropriate  Type of Therapy: Psycho-education Group  Summary of Progress: Pt learned the WRAP skill of using a Daily Maintenance List to manage and improve mental health wellness.  Carman Ching, LCSW

## 2012-08-26 ENCOUNTER — Encounter (HOSPITAL_COMMUNITY): Payer: Self-pay | Admitting: Psychiatry

## 2012-08-26 ENCOUNTER — Other Ambulatory Visit (HOSPITAL_COMMUNITY): Payer: 59 | Admitting: Psychiatry

## 2012-08-26 ENCOUNTER — Ambulatory Visit (HOSPITAL_COMMUNITY)
Admission: RE | Admit: 2012-08-26 | Discharge: 2012-08-26 | Disposition: A | Discharge status: Home / Self Care | Payer: 59 | Attending: Psychiatry | Admitting: Psychiatry

## 2012-08-26 DIAGNOSIS — F329 Major depressive disorder, single episode, unspecified: Secondary | ICD-10-CM
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
## 2012-08-27 ENCOUNTER — Other Ambulatory Visit (HOSPITAL_COMMUNITY): Payer: 59 | Admitting: Psychiatry

## 2012-08-27 DIAGNOSIS — F329 Major depressive disorder, single episode, unspecified: Secondary | ICD-10-CM

## 2012-08-30 ENCOUNTER — Other Ambulatory Visit (HOSPITAL_COMMUNITY): Payer: 59 | Admitting: Psychiatry

## 2012-08-30 DIAGNOSIS — F329 Major depressive disorder, single episode, unspecified: Secondary | ICD-10-CM

## 2012-08-30 NOTE — Progress Notes (Signed)
    Daily Group Progress Note  Program: IOP  Group Time: 9:00-10:30 am   Participation Level: Active  Behavioral Response: Monopolizing  Type of Therapy:  Process Group  Summary of Progress: Pt required continuous redirection to allow others to share and was struggling to respond to the redirection. Group members became so frustrated they began leaving the group. The group decided to take an early break due to their frustration. Writer and the PA met individual with the Pt during the break to discuss if the group was the most helpful form of treatment given Pts limited social ability in a group setting. Pt appeared discouraged by the reactions from the other group members and requested to return to individual therapy. 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 We are trying to make this a positive experience for the Pt while also maintaining the needs of the other group members. Pt said she wants to have closure with the group tomorrow and return to [facility] for individual therapy.      Group Time: 10:30 am - 12:00 pm   Participation Level:  Minimal  Behavioral Response: Appropriate  Type of Therapy: Psycho-education Group  Summary of Progress: Pt participated in a group with a focus on grief and loss and identified current losses impacting wellness and effective ways to grieve them.   Carman Ching, LCSW

## 2012-08-30 NOTE — Progress Notes (Signed)
    Daily Group Progress Note  Program: IOP  Group Time: 9:00-10:30 am   Participation Level: Active  Behavioral Response: Monopolizing  Type of Therapy:  Process Group  Summary of Progress: Pt was talkative and required regular amounts of redirection. Pt commented quickly after each group member talked and the members expressed frustration with Pts inability to allow others time to share. Pt was redirected several times but continued the behaviors. Their is concern that Pt is not able to be effective with her social skills at this time in this level of care and fear that Pt may have a negative group experience due to other group members reactions to this inability.      Group Time: 10:30 am - 12:00 pm   Participation Level:  Active  Behavioral Response: Appropriate  Type of Therapy: Psycho-education Group  Summary of Progress:  Pt participated in a guided meditation to reduce stress and discussed how the relaxation technique was beneficial in reducing the patients feelings of stress.  Carman Ching, LCSW

## 2012-08-31 ENCOUNTER — Other Ambulatory Visit (HOSPITAL_COMMUNITY): Payer: 59 | Admitting: Psychiatry

## 2012-08-31 DIAGNOSIS — F32A Depression, unspecified: Secondary | ICD-10-CM

## 2012-08-31 DIAGNOSIS — F329 Major depressive disorder, single episode, unspecified: Secondary | ICD-10-CM

## 2012-09-01 ENCOUNTER — Other Ambulatory Visit (HOSPITAL_COMMUNITY): Payer: 59

## 2012-09-01 NOTE — Progress Notes (Signed)
Patient ID: Tara Thompson, female   DOB: [birth year], 27 y.o.   MRN: [id number] D:  This is a 27 yo single caucasian female, who was transitioned from [place]. Pt was admitted due to treatment for Bipolar Disorder. States she was dx'd at age 34, but was untreated for ~ ten years. Has had multiple admissions at [facility] and one previous admit at [facility]. Currently under the services of Donnie Aho, NP and [employer], LCSW. Reports seeing both of them for ~ one month.  Pt observed demonstrating a wide range of emotions during session, pt is hysterically crying one moment and laughing,smiling, and cursing the next. Pt having difficulty regulating her emotions. Reports that everything is a Administrator, sports. Patient is a poor historian.  Apparently, pt was discharged yesterday while this writer was on vacation.  CC: group note from yesterday. A:  D/C.  F/U with Donnie Aho, NP and [facility], LCSW.  Pt had planned to RTW on 09-01-12, per her own request.

## 2012-09-01 NOTE — Progress Notes (Signed)
    Daily Group Progress Note  Program: IOP  Group Time: 9:00-10:30 am   Participation Level: Active  Behavioral Response: Appropriate  Type of Therapy:  Process Group  Summary of Progress: Today was Pts final day in the group. Pt still struggles with making impulsive decisions and in social situations reading the cues of others and conforming to the social structure needed for that situation. Pt said she does not have any depression and plans to return to her outpatient therapist for continued treatment. Ruling out possible diagnosis of FAS.     Group Time: 10:30 am - 12:00 pm   Participation Level:  Active  Behavioral Response: Appropriate  Type of Therapy: Psycho-education Group  Summary of Progress: Pt was introduced to the topic of how to set healthy boundaries to ensure optimal wellness.  Carman Ching, LCSW

## 2012-09-01 NOTE — Patient Instructions (Addendum)
Pt was discharged yesterday.  Will follow up with Donnie Aho, NP and [facility], LCSW.  Encouraged support groups.

## 2012-09-02 ENCOUNTER — Other Ambulatory Visit (HOSPITAL_COMMUNITY): Payer: 59

## 2012-09-03 ENCOUNTER — Other Ambulatory Visit (HOSPITAL_COMMUNITY): Payer: 59

## 2012-09-06 ENCOUNTER — Other Ambulatory Visit (HOSPITAL_COMMUNITY): Payer: 59

## 2012-09-07 ENCOUNTER — Other Ambulatory Visit (HOSPITAL_COMMUNITY): Payer: 59

## 2012-09-08 ENCOUNTER — Other Ambulatory Visit (HOSPITAL_COMMUNITY): Payer: 59

## 2012-09-09 ENCOUNTER — Other Ambulatory Visit (HOSPITAL_COMMUNITY): Payer: 59

## 2012-09-10 ENCOUNTER — Other Ambulatory Visit (HOSPITAL_COMMUNITY): Payer: 59

## 2012-09-13 ENCOUNTER — Other Ambulatory Visit (HOSPITAL_COMMUNITY): Payer: 59

## 2012-09-14 ENCOUNTER — Other Ambulatory Visit (HOSPITAL_COMMUNITY): Payer: 59

## 2012-09-15 ENCOUNTER — Other Ambulatory Visit (HOSPITAL_COMMUNITY): Payer: 59

## 2012-09-30 NOTE — Progress Notes (Addendum)
Patient ID: GARNELL BEGEMAN, female   DOB: 1985/03/12, 27 y.o.   MRN: [id number]  Discharge Note  Patient: Tara Thompson DOB: 02/28/1985 Date of Admission: 08/25/2012 Date of Discharge: 08/31/2012  Reason for Admission: Intensive outpatient treatment for anxiety and mood disorder following an inpatient admission at old [facility].  Hospital course Tricha was discharged from [facility] where she had been hospitalized do to suicidal thoughts, and was referred to the intensive outpatient program at [facility] for continued treatment of her depression and anxiety. She was admitted to the program and continued on her medications per her discharge from [facility], those being Risperdal 3 mg at bedtime, Lamictal 150 mg daily, and Klonopin 0.5 mg 3 times daily. Tywana struggle to control her behavior in group, and displayed in intensity and urgency consistent with mania. She required frequent redirection, and at times was unresponsive to such redirection. She demanded an excess of the group time, which was frustrating to the other members of the group as evidenced by their comments. Staff conferred regarding Paiden's behavior, and met outside of group with Majorie to formulate a plan that would best benefit Najmah's treatment. Rowena thought that it would be best if she were discharged, so we had her return for one more day to bring closure to her experience with the group. At discharge she denied any suicidal or homicidal ideation. She denied any auditory or visual hallucinations.  Mental status at discharge: Alert and oriented and in no acute distress, anxious in mood with a congruent affect, cognitive function within normal limits, insight is poor and judgment is fair, speech is clear and coherent at a regular rate and rhythm and normal volume, memory and concentration intact.  Lab Results: No results found for this or any previous visit (from the past 48 hour(s)).  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  The patient received suicide prevention pamphlet: Yes  Guadlupe Spanish. Alvie Speltz, MHS, PA-C

## 2013-04-29 ENCOUNTER — Emergency Department (HOSPITAL_COMMUNITY)
Admission: EM | Admit: 2013-04-29 | Discharge: 2013-04-29 | Disposition: A | Discharge status: Home / Self Care | Payer: Medicaid Other | Source: Home / Self Care | Attending: Family Medicine | Admitting: Family Medicine

## 2013-04-29 ENCOUNTER — Emergency Department (HOSPITAL_COMMUNITY)
Admission: EM | Admit: 2013-04-29 | Discharge: 2013-04-29 | Disposition: A | Discharge status: Home / Self Care | Payer: Medicaid Other | Source: Home / Self Care

## 2013-04-29 ENCOUNTER — Encounter (HOSPITAL_COMMUNITY): Payer: Self-pay | Admitting: Emergency Medicine

## 2013-04-29 ENCOUNTER — Emergency Department (INDEPENDENT_AMBULATORY_CARE_PROVIDER_SITE_OTHER): Payer: Medicaid Other

## 2013-04-29 DIAGNOSIS — J40 Bronchitis, not specified as acute or chronic: Principal | ICD-10-CM

## 2013-04-29 DIAGNOSIS — J410 Simple chronic bronchitis: Secondary | ICD-10-CM

## 2013-04-29 DIAGNOSIS — Z720 Tobacco use: Secondary | ICD-10-CM

## 2013-04-29 MED ORDER — GUAIFENESIN-CODEINE 100-10 MG/5ML PO SYRP: 10.0000 mL | ORAL_SOLUTION | Freq: Four times a day (QID) | ORAL | Status: DC | PRN

## 2013-04-29 MED ORDER — MINOCYCLINE HCL 100 MG PO CAPS: 100.0000 mg | ORAL_CAPSULE | Freq: Two times a day (BID) | ORAL | Status: DC

## 2013-04-29 NOTE — ED Notes (Signed)
Pt  Reports  A  Cough  That is  At times  Productive  And at other  Times  Non  Productive  Pt        -      Pt  Also  Reports     Body  Aches   And  Malaise  As  Well       Pt  Reports  The  Symptoms  For  Several  Weeks

## 2013-04-29 NOTE — Discharge Instructions (Signed)
Take all of medicine, drink lots of fluids, no more smoking, see your doctor if further problems  °

## 2013-04-29 NOTE — ED Provider Notes (Signed)
CSN: [id number]     Arrival date & time 04/29/13  1211 History   First MD Initiated Contact with Patient 04/29/13 1355     Chief Complaint  Patient presents with  . Cough   (Consider location/radiation/quality/duration/timing/severity/associated sxs/prior Treatment) Patient is a 28 y.o. female presenting with cough. The history is provided by the patient.  Cough Cough characteristics:  Productive Sputum characteristics:  Green Severity:  Moderate Onset quality:  Gradual Duration:  2 weeks Chronicity:  New Smoker: yes   Context: sick contacts   Context comment:  Daughter sick. Relieved by:  None tried Worsened by:  Nothing tried Ineffective treatments:  None tried Associated symptoms: rhinorrhea   Associated symptoms: no fever and no shortness of breath     Past Medical History  Diagnosis Date  . Ovarian cyst   . Abnormal Pap smear     LSIL>normal follow-up pap  . Anxiety   . Mental disorder   . Depression   . Bipolar disorder    Past Surgical History  Procedure Laterality Date  . Colposcopy    . Wisdom tooth extraction  age 28   Family History  Problem Relation Age of Onset  . Cancer Paternal Grandmother   . Depression Maternal Uncle   . Bipolar disorder Maternal Grandmother   . Depression Paternal Aunt   . Drug abuse Brother    History  Substance Use Topics  . Smoking status: Current Every Day Smoker -- 0.50 packs/day for 11 years    Types: Cigarettes  . Smokeless tobacco: Never Used  . Alcohol Use: 1.2 oz/week    1 Glasses of wine, 1 Cans of beer per week     Comment: occasional - one glass of wine per month   OB History   Grav Para Term Preterm Abortions TAB SAB Ect Mult Living   1 1 1       1      Review of Systems  Constitutional: Negative.  Negative for fever.  HENT: Positive for congestion, postnasal drip and rhinorrhea.   Respiratory: Positive for cough. Negative for shortness of breath.   Cardiovascular: Negative.   Gastrointestinal:  Negative.     Allergies  Review of patient's allergies indicates no known allergies.  Home Medications   Current Outpatient Rx  Name  Route  Sig  Dispense  Refill  . clonazePAM (KLONOPIN) 0.5 MG tablet   Oral   Take 0.5 mg by mouth 3 (three) times daily as needed for anxiety.         Marland Kitchen. guaiFENesin-codeine (ROBITUSSIN AC) 100-10 MG/5ML syrup   Oral   Take 10 mLs by mouth 4 (four) times daily as needed for cough.   180 mL   0   . lamoTRIgine (LAMICTAL) 150 MG tablet   Oral   Take 150 mg by mouth Nightly.         . minocycline (MINOCIN,DYNACIN) 100 MG capsule   Oral   Take 1 capsule (100 mg total) by mouth 2 (two) times daily.   20 capsule   0   . risperiDONE (RISPERDAL) 0.25 MG tablet   Oral   Take 1 mg by mouth Nightly. Take three at bedtime (3mg )          BP 136/78  Pulse 72  Temp(Src) 98.3 F (36.8 C) (Oral)  Resp 14  SpO2 98%  LMP 04/29/2013 Physical Exam  Nursing note and vitals reviewed. Constitutional: She is oriented to person, place, and time. She appears well-developed and well-nourished.  HENT:  Right Ear: External ear normal.  Left Ear: External ear normal.  Mouth/Throat: Oropharynx is clear and moist.  Neck: Normal range of motion. Neck supple.  Cardiovascular: Regular rhythm and normal heart sounds.   Pulmonary/Chest: Effort normal. She has no wheezes. She has rhonchi. She has no rales.  Lymphadenopathy:    She has no cervical adenopathy.  Neurological: She is alert and oriented to person, place, and time.  Skin: Skin is warm and dry.    ED Course  Procedures (including critical care time) Labs Review Labs Reviewed - No data to display Imaging Review Dg Chest 2 View  04/29/2013   CLINICAL DATA:  Cough.  Smoker.  EXAM: CHEST  2 VIEW  COMPARISON:  None.  FINDINGS: Heart size is normal. Mild hyperinflation noted. No evidence of pulmonary infiltrate or edema. No evidence of pleural effusion. No mass or lymphadenopathy identified.   IMPRESSION: Mild hyperinflation.  No active cardiopulmonary disease.   Electronically Signed   By: Myles Rosenthal M.D.   On: 04/29/2013 14:28   X-rays reviewed and report per radiologist.   MDM   1. Bronchitis due to tobacco use        Linna Hoff, MD 04/29/13 1442

## 2013-06-02 ENCOUNTER — Other Ambulatory Visit: Payer: Medicaid Other | Admitting: Family Medicine

## 2013-06-02 ENCOUNTER — Other Ambulatory Visit (HOSPITAL_COMMUNITY)
Admission: RE | Admit: 2013-06-02 | Discharge: 2013-06-02 | Disposition: A | Discharge status: Home / Self Care | Payer: Medicaid Other | Source: Ambulatory Visit | Attending: Family Medicine | Admitting: Family Medicine

## 2013-06-02 DIAGNOSIS — Z113 Encounter for screening for infections with a predominantly sexual mode of transmission: Secondary | ICD-10-CM | POA: Insufficient documentation

## 2013-06-02 DIAGNOSIS — N760 Acute vaginitis: Secondary | ICD-10-CM | POA: Insufficient documentation

## 2013-06-05 LAB — URINE CYTOLOGY ANCILLARY ONLY
Bacterial vaginitis: POSITIVE — AB
CANDIDA VAGINITIS: NEGATIVE
CHLAMYDIA, DNA PROBE: NEGATIVE
Neisseria Gonorrhea: NEGATIVE
Trichomonas: NEGATIVE

## 2013-06-09 ENCOUNTER — Encounter: Payer: Self-pay | Admitting: Obstetrics

## 2013-06-16 ENCOUNTER — Ambulatory Visit (INDEPENDENT_AMBULATORY_CARE_PROVIDER_SITE_OTHER): Payer: Medicaid Other | Admitting: Obstetrics & Gynecology

## 2013-06-16 ENCOUNTER — Encounter: Payer: Self-pay | Admitting: Obstetrics

## 2013-06-16 VITALS — BP 118/79 | HR 93 | Temp 98.1°F | Wt 93.0 lb

## 2013-06-16 DIAGNOSIS — Z3009 Encounter for other general counseling and advice on contraception: Secondary | ICD-10-CM

## 2013-06-16 NOTE — Progress Notes (Signed)
Subjective:    Tara Thompson is a 28 y.o. female who presents for contraception counseling. The patient has no complaints today. The patient is not currently sexually active. Pertinent past medical history: none.     Menstrual History: OB History   Grav Para Term Preterm Abortions TAB SAB Ect Mult Living   1 1 1       1        Patient's last menstrual period was 06/11/2013.   Patient Active Problem List   Diagnosis Date Noted  . Depression 08/25/2012  . Chronic pelvic pain in female 04/07/2012   Past Medical History  Diagnosis Date  . Ovarian cyst   . Abnormal Pap smear     LSIL>normal follow-up pap  . Anxiety   . Mental disorder   . Depression   . Bipolar disorder     Past Surgical History  Procedure Laterality Date  . Colposcopy    . Wisdom tooth extraction  age 28    Current outpatient prescriptions:clonazePAM (KLONOPIN) 0.5 MG tablet, Take 0.5 mg by mouth 3 (three) times daily as needed for anxiety., Disp: , Rfl: ;  lurasidone (LATUDA) 80 MG TABS tablet, Take 80 mg by mouth daily with breakfast., Disp: , Rfl: ;  guaiFENesin-codeine (ROBITUSSIN AC) 100-10 MG/5ML syrup, Take 10 mLs by mouth 4 (four) times daily as needed for cough., Disp: 180 mL, Rfl: 0 lamoTRIgine (LAMICTAL) 150 MG tablet, Take 150 mg by mouth Nightly., Disp: , Rfl: ;  minocycline (MINOCIN,DYNACIN) 100 MG capsule, Take 1 capsule (100 mg total) by mouth 2 (two) times daily., Disp: 20 capsule, Rfl: 0 No Known Allergies  History  Substance Use Topics  . Smoking status: Current Every Day Smoker -- 0.50 packs/day for 11 years    Types: Cigarettes  . Smokeless tobacco: Never Used  . Alcohol Use: No     Comment: occasional - one glass of wine per month    Family History  Problem Relation Age of Onset  . Cancer Paternal Grandmother   . Depression Maternal Uncle   . Bipolar disorder Maternal Grandmother   . Depression Paternal Aunt   . Drug abuse Brother        Review of Systems Constitutional:  negative for weight loss Genitourinary:negative for abnormal menstrual periods and vaginal discharge   Objective:   BP 118/79  Pulse 93  Temp(Src) 98.1 F (36.7 C)  Wt 42.185 kg (93 lb)  LMP 06/11/2013   General:   alert  Skin:   no rash or abnormalities  Lungs:   clear to auscultation bilaterally  Heart:   regular rate and rhythm, S1, S2 normal, no murmur, click, rub or gallop  Breasts:   normal without suspicious masses, skin or nipple changes or axillary nodes  Abdomen:  normal findings: no organomegaly, soft, non-tender and no hernia  Pelvis:  External genitalia: normal general appearance Urinary system: urethral meatus normal and bladder without fullness, nontender Vaginal: normal without tenderness, induration or masses Cervix: normal appearance Adnexa: normal bimanual exam Uterus: anteverted and non-tender, normal size   Lab Review Urine pregnancy test Labs reviewed no Radiologic studies reviewed no     Assessment:    28 y.o. requests a sterilization procedure  Plan:    All questions answered. Agricultural engineerducational material distributed. Schedule for surgery - laparoscopic BTL; risks/benefits/alternatives reviewed.  Meds ordered this encounter  Medications  . lurasidone (LATUDA) 80 MG TABS tablet    Sig: Take 80 mg by mouth daily with breakfast.  Follow up as needed.

## 2013-06-17 ENCOUNTER — Encounter: Payer: Self-pay | Admitting: Obstetrics & Gynecology

## 2013-06-17 NOTE — Patient Instructions (Signed)
Sterilization Information, Female Female sterilization is a procedure to permanently prevent pregnancy. There are different ways to perform sterilization, but all either block or close the fallopian tubes so that your eggs cannot reach your uterus. If your egg cannot reach your uterus, sperm cannot fertilize the egg, and you cannot get pregnant.  Sterilization is performed by a surgical procedure. Sometimes these procedures are performed in a hospital while a patient is asleep. Sometimes they can be done in a clinic setting with the patient awake. The fallopian tubes can be surgically cut, tied, or sealed through a procedure called tubal ligation. The fallopian tubes can also be closed with clips or rings. Sterilization can also be done by placing a tiny coil into each fallopian tube, which causes scar tissue to grow inside the tube. The scar tissue then blocks the tubes.  Discuss sterilization with your caregiver to answer any concerns you or your partner may have. You may want to ask what type of sterilization your caregiver performs. Some caregivers may not perform all the various options. Sterilization is permanent and should only be done if you are sure you do not want children or do not want any more children. Having a sterilization reversed may not be successful.  STERILIZATION PROCEDURES  Laparoscopic sterilization. This is a surgical method performed at a time other than right after childbirth. Two incisions are made in the lower abdomen. A thin, lighted tube (laparoscope) is inserted into one of the incisions and is used to perform the procedure. The fallopian tubes are closed with a ring or a clip. An instrument that uses heat could be used to seal the tubes closed (electrocautery).   Mini-laparotomy. This is a surgical method done 1 or 2 days after giving birth. Typically, a small incision is made just below the belly button (umbilicus) and the fallopian tubes are exposed. The tubes can then be  sealed, tied, or cut.   Hysteroscopic sterilization. This is performed at a time other than right after childbirth. A tiny, spring-like coil is inserted through the cervix and uterus and placed into the fallopian tubes. The coil causes scaring and blocks the tubes. Other forms of contraception should be used for 3 months after the procedure to allow the scar tissue to form completely. Additionally, it is required hysterosalpingography be done 3 months later to ensure that the procedure was successful. Hysterosalpingography is a procedure that uses X-rays to look at your uterus and fallopian tubes after a material to make them show up better has been inserted. IS STERILIZATION SAFE? Sterilization is considered safe with very rare complications. Risks depend on the type of procedure you have. As with any surgical procedure, there are risks. Some risks of sterilization by any means include:   Bleeding.  Infection.  Reaction to anesthesia medicine.  Injury to surrounding organs. Risks specific to having hysteroscopic coils placed include:  The coils may not be placed correctly the first time.   The coils may move out of place.   The tubes may not get completely blocked after 3 months.   Injury to surrounding organs when placing the coil.  HOW EFFECTIVE IS FEMALE STERILIZATION? Sterilization is nearly 100% effective, but it can fail. Depending on the type of sterilization, the rate of failure can be as high as 3%. After hysteroscopic sterilization with placement of fallopian tube coils, you will need back-up birth control for 3 months after the procedure. Sterilization is effective for a lifetime.  BENEFITS OF STERILIZATION  It does   not affect your hormones, and therefore will not affect your menstrual periods, sexual desire, or performance.   It is effective for a lifetime.   It is safe.   You do not need to worry about getting pregnant. Keep in mind that if you had the  hysteroscopic placement procedure, you must wait 3 months after the procedure (or until your caregiver confirms) before pregnancy is not considered possible.   There are no side effects unlike other types of birth control (contraception).  DRAWBACKS OF STERILIZATION  You must be sure you do not want children or any more children. The procedure is permanent.   It does not provide protection against sexually transmitted infections (STIs).   The tubes can grow back together. If this happens, there is a risk of pregnancy. There is also an increased risk (50%) of pregnancy being an ectopic pregnancy. This is a pregnancy that happens outside of the uterus. Document Released: 07/16/2007 Document Revised: 07/29/2011 Document Reviewed: 05/15/2011 Exit[employer] Patient Information 2014 [employer].  

## 2013-07-14 ENCOUNTER — Encounter (HOSPITAL_COMMUNITY): Payer: Self-pay | Admitting: Pharmacist

## 2013-07-21 NOTE — H&P (Signed)
  Chief Complaint: 28 y.o. who presents for a laparoscopic BTL  Details of Present Illness: See above.  There were no vitals taken for this visit.  Past Medical History  Diagnosis Date  . Ovarian cyst   . Abnormal Pap smear     LSIL>normal follow-up pap  . Anxiety   . Mental disorder   . Depression   . Bipolar disorder    History   Social History  . Marital Status: Single    Spouse Name: N/A    Number of Children: N/A  . Years of Education: N/A   Occupational History  . Not on file.   Social History Main Topics  . Smoking status: Current Every Day Smoker -- 0.50 packs/day for 11 years    Types: Cigarettes  . Smokeless tobacco: Never Used  . Alcohol Use: No     Comment: occasional - one glass of wine per month  . Drug Use: No     Comment: Reports she quit THC early July 2014, used cocaine once in July of 2013  . Sexual Activity: No   Other Topics Concern  . Not on file   Social History Narrative   08/25/2012 AHW  Shvonne was born and grew up in [place], [place]. She has a younger brother. Her parents divorced when she was 68 years old. She reports that her childhood was "awful." She reports that she witnessed her parents have frequent verbal confrontations. She graduated from high school. 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 She has never been married. She has a 40-year-old daughter. She currently lives with her mother and her daughter. Her hobbies include reading and painting. She affiliates as a Curator. She denies any legal difficulties. She reports that her social support system consists of her mother and father. 08/25/2012 AHW    Family History  Problem Relation Age of Onset  . Cancer Paternal Grandmother   . Depression Maternal Uncle   . Bipolar disorder Maternal Grandmother   . Depression Paternal Aunt   . Drug abuse Brother     Pertinent items are noted in HPI.  Pre-Op  Diagnosis: desires sterilization [phone]   Planned Procedure: Procedure(s): LAPAROSCOPIC TUBAL LIGATION  I have reviewed the patient's history and have completed the physical exam and AERIE DELLACAMERA is acceptable for surgery.  Roseanna Rainbow, MD 07/21/2013 3:19 PM

## 2013-07-22 ENCOUNTER — Encounter (HOSPITAL_COMMUNITY)
Admission: RE | Disposition: A | Discharge status: Home / Self Care | Payer: Self-pay | Source: Ambulatory Visit | Attending: Obstetrics & Gynecology

## 2013-07-22 ENCOUNTER — Ambulatory Visit (HOSPITAL_COMMUNITY)
Admission: RE | Admit: 2013-07-22 | Discharge: 2013-07-22 | Disposition: A | Discharge status: Home / Self Care | Payer: Medicaid Other | Source: Ambulatory Visit | Attending: Obstetrics & Gynecology | Admitting: Obstetrics & Gynecology

## 2013-07-22 ENCOUNTER — Encounter (HOSPITAL_COMMUNITY): Payer: Self-pay | Admitting: *Deleted

## 2013-07-22 ENCOUNTER — Ambulatory Visit (HOSPITAL_COMMUNITY): Payer: Medicaid Other | Admitting: Anesthesiology

## 2013-07-22 ENCOUNTER — Encounter (HOSPITAL_COMMUNITY): Payer: Medicaid Other | Admitting: Anesthesiology

## 2013-07-22 DIAGNOSIS — Z641 Problems related to multiparity: Secondary | ICD-10-CM | POA: Insufficient documentation

## 2013-07-22 DIAGNOSIS — Z302 Encounter for sterilization: Secondary | ICD-10-CM

## 2013-07-22 DIAGNOSIS — F329 Major depressive disorder, single episode, unspecified: Secondary | ICD-10-CM | POA: Insufficient documentation

## 2013-07-22 DIAGNOSIS — F3289 Other specified depressive episodes: Secondary | ICD-10-CM | POA: Insufficient documentation

## 2013-07-22 DIAGNOSIS — F17200 Nicotine dependence, unspecified, uncomplicated: Secondary | ICD-10-CM | POA: Insufficient documentation

## 2013-07-22 HISTORY — PX: LAPAROSCOPIC TUBAL LIGATION: SHX1937

## 2013-07-22 LAB — CBC
HEMATOCRIT: 42 % (ref 36.0–46.0)
Hemoglobin: 14 g/dL (ref 12.0–15.0)
MCH: 29.8 pg (ref 26.0–34.0)
MCHC: 33.3 g/dL (ref 30.0–36.0)
MCV: 89.4 fL (ref 78.0–100.0)
Platelets: 414 10*3/uL — ABNORMAL HIGH (ref 150–400)
RBC: 4.7 MIL/uL (ref 3.87–5.11)
RDW: 13.5 % (ref 11.5–15.5)
WBC: 11.5 10*3/uL — ABNORMAL HIGH (ref 4.0–10.5)

## 2013-07-22 LAB — PREGNANCY, URINE: Preg Test, Ur: NEGATIVE

## 2013-07-22 SURGERY — LIGATION, FALLOPIAN TUBE, LAPAROSCOPIC
Anesthesia: General | Site: Abdomen | Laterality: Bilateral

## 2013-07-22 MED ORDER — FENTANYL CITRATE 0.05 MG/ML IJ SOLN
25.0000 ug | INTRAMUSCULAR | Status: DC | PRN
  Administered 2013-07-22: 50 ug via INTRAVENOUS

## 2013-07-22 MED ORDER — MIDAZOLAM HCL 2 MG/2ML IJ SOLN
INTRAMUSCULAR | Status: DC | PRN
  Administered 2013-07-22: 2 mg via INTRAVENOUS

## 2013-07-22 MED ORDER — PROPOFOL 10 MG/ML IV BOLUS
INTRAVENOUS | Status: DC | PRN
  Administered 2013-07-22: 150 mg via INTRAVENOUS

## 2013-07-22 MED ORDER — GLYCOPYRROLATE 0.2 MG/ML IJ SOLN
INTRAMUSCULAR | Status: AC
Start: 2013-07-22 — End: 2013-07-22
  Filled 2013-07-22: qty 3

## 2013-07-22 MED ORDER — PROPOFOL 10 MG/ML IV EMUL
INTRAVENOUS | Status: AC
  Filled 2013-07-22: qty 20

## 2013-07-22 MED ORDER — DEXAMETHASONE SODIUM PHOSPHATE 10 MG/ML IJ SOLN
INTRAMUSCULAR | Status: DC | PRN
  Administered 2013-07-22: 10 mg via INTRAVENOUS

## 2013-07-22 MED ORDER — ONDANSETRON HCL 4 MG/2ML IJ SOLN
INTRAMUSCULAR | Status: AC
  Filled 2013-07-22: qty 2

## 2013-07-22 MED ORDER — FENTANYL CITRATE 0.05 MG/ML IJ SOLN
INTRAMUSCULAR | Status: AC
  Filled 2013-07-22: qty 5

## 2013-07-22 MED ORDER — FENTANYL CITRATE 0.05 MG/ML IJ SOLN
INTRAMUSCULAR | Status: AC
  Administered 2013-07-22: 50 ug via INTRAVENOUS
  Filled 2013-07-22: qty 2

## 2013-07-22 MED ORDER — BUPIVACAINE HCL (PF) 0.25 % IJ SOLN
INTRAMUSCULAR | Status: DC | PRN
  Administered 2013-07-22: 5 mL

## 2013-07-22 MED ORDER — BUPIVACAINE HCL (PF) 0.25 % IJ SOLN
INTRAMUSCULAR | Status: AC
  Filled 2013-07-22: qty 30

## 2013-07-22 MED ORDER — GLYCOPYRROLATE 0.2 MG/ML IJ SOLN
INTRAMUSCULAR | Status: DC | PRN
  Administered 2013-07-22: 0.6 mg via INTRAVENOUS

## 2013-07-22 MED ORDER — LIDOCAINE HCL (CARDIAC) 20 MG/ML IV SOLN
INTRAVENOUS | Status: DC | PRN
  Administered 2013-07-22: 100 mg via INTRAVENOUS

## 2013-07-22 MED ORDER — LIDOCAINE HCL (CARDIAC) 20 MG/ML IV SOLN
INTRAVENOUS | Status: AC
  Filled 2013-07-22: qty 5

## 2013-07-22 MED ORDER — DEXAMETHASONE SODIUM PHOSPHATE 10 MG/ML IJ SOLN
INTRAMUSCULAR | Status: AC
  Filled 2013-07-22: qty 1

## 2013-07-22 MED ORDER — NEOSTIGMINE METHYLSULFATE 10 MG/10ML IV SOLN
INTRAVENOUS | Status: AC
  Filled 2013-07-22: qty 1

## 2013-07-22 MED ORDER — LACTATED RINGERS IV SOLN
INTRAVENOUS | Status: DC
  Administered 2013-07-22 (×3): via INTRAVENOUS

## 2013-07-22 MED ORDER — ROCURONIUM BROMIDE 100 MG/10ML IV SOLN
INTRAVENOUS | Status: AC
  Filled 2013-07-22: qty 1

## 2013-07-22 MED ORDER — KETOROLAC TROMETHAMINE 30 MG/ML IJ SOLN
INTRAMUSCULAR | Status: DC | PRN
  Administered 2013-07-22: 30 mg via INTRAVENOUS

## 2013-07-22 MED ORDER — FENTANYL CITRATE 0.05 MG/ML IJ SOLN
INTRAMUSCULAR | Status: DC | PRN
Start: 2013-07-22 — End: 2013-07-22
  Administered 2013-07-22: 100 ug via INTRAVENOUS

## 2013-07-22 MED ORDER — KETOROLAC TROMETHAMINE 30 MG/ML IJ SOLN
INTRAMUSCULAR | Status: AC
  Filled 2013-07-22: qty 1

## 2013-07-22 MED ORDER — MIDAZOLAM HCL 2 MG/2ML IJ SOLN
INTRAMUSCULAR | Status: AC
  Filled 2013-07-22: qty 2

## 2013-07-22 MED ORDER — ROCURONIUM BROMIDE 100 MG/10ML IV SOLN
INTRAVENOUS | Status: DC | PRN
  Administered 2013-07-22: 20 mg via INTRAVENOUS

## 2013-07-22 MED ORDER — ONDANSETRON HCL 4 MG/2ML IJ SOLN
INTRAMUSCULAR | Status: DC | PRN
  Administered 2013-07-22: 4 mg via INTRAVENOUS

## 2013-07-22 MED ORDER — NEOSTIGMINE METHYLSULFATE 10 MG/10ML IV SOLN
INTRAVENOUS | Status: DC | PRN
  Administered 2013-07-22: 4 mg via INTRAVENOUS

## 2013-07-22 MED ORDER — OXYCODONE-ACETAMINOPHEN 5-325 MG PO TABS: 2.0000 | ORAL_TABLET | Freq: Four times a day (QID) | ORAL | Status: DC | PRN

## 2013-07-22 SURGICAL SUPPLY — 24 items
ADH SKN CLS APL DERMABOND .7 (GAUZE/BANDAGES/DRESSINGS) ×1
CATH ROBINSON RED A/P 16FR (CATHETERS) ×3 IMPLANT
CHLORAPREP W/TINT 26ML (MISCELLANEOUS) ×3 IMPLANT
CLOTH BEACON ORANGE TIMEOUT ST (SAFETY) ×3 IMPLANT
DECANTER SPIKE VIAL GLASS SM (MISCELLANEOUS) ×3 IMPLANT
DERMABOND ADVANCED (GAUZE/BANDAGES/DRESSINGS) ×2
DERMABOND ADVANCED .7 DNX12 (GAUZE/BANDAGES/DRESSINGS) ×1 IMPLANT
DRSG OPSITE POSTOP 3X4 (GAUZE/BANDAGES/DRESSINGS) ×6 IMPLANT
EVACUATOR SMOKE 8.L (FILTER) ×3 IMPLANT
GLOVE BIO SURGEON STRL SZ 6.5 (GLOVE) ×4 IMPLANT
GLOVE BIO SURGEONS STRL SZ 6.5 (GLOVE) ×2
GLOVE BIOGEL PI IND STRL 7.0 (GLOVE) IMPLANT
GLOVE BIOGEL PI INDICATOR 7.0 (GLOVE) ×10
GLOVE SURG SS PI 7.0 STRL IVOR (GLOVE) ×14 IMPLANT
GOWN STRL REUS W/TWL LRG LVL3 (GOWN DISPOSABLE) ×6 IMPLANT
PACK LAPAROSCOPY BASIN (CUSTOM PROCEDURE TRAY) ×3 IMPLANT
SCRUB PCMX 4 OZ (MISCELLANEOUS) ×3 IMPLANT
SUT VIC AB 3-0 PS2 18 (SUTURE)
SUT VIC AB 3-0 PS2 18XBRD (SUTURE) IMPLANT
SUT VICRYL 0 UR6 27IN ABS (SUTURE) ×3 IMPLANT
TOWEL OR 17X24 6PK STRL BLUE (TOWEL DISPOSABLE) ×6 IMPLANT
TROCAR XCEL NON-BLD 11X100MML (ENDOMECHANICALS) ×3 IMPLANT
WARMER LAPAROSCOPE (MISCELLANEOUS) ×3 IMPLANT
WATER STERILE IRR 1000ML POUR (IV SOLUTION) ×3 IMPLANT

## 2013-07-22 NOTE — Anesthesia Preprocedure Evaluation (Signed)
Anesthesia Evaluation  Patient identified by MRN, date of birth, ID band Patient awake    Reviewed: Allergy & Precautions, H&P , Patient's Chart, lab work & pertinent test results, reviewed documented beta blocker date and time   Airway Mallampati: II  TM Distance: >3 FB Neck ROM: full    Dental no notable dental hx.    Pulmonary Current Smoker,  breath sounds clear to auscultation  Pulmonary exam normal       Cardiovascular Rhythm:regular Rate:Normal     Neuro/Psych    GI/Hepatic   Endo/Other    Renal/GU      Musculoskeletal   Abdominal   Peds  Hematology   Anesthesia Other Findings   Reproductive/Obstetrics                             Anesthesia Physical Anesthesia Plan  ASA: II  Anesthesia Plan: General   Post-op Pain Management:    Induction: Intravenous  Airway Management Planned: Oral ETT  Additional Equipment:   Intra-op Plan:   Post-operative Plan: Extubation in OR  Informed Consent: I have reviewed the patients History and Physical, chart, labs and discussed the procedure including the risks, benefits and alternatives for the proposed anesthesia with the patient or authorized representative who has indicated his/her understanding and acceptance.   Dental Advisory Given and Dental advisory given  Plan Discussed with: CRNA and Surgeon  Anesthesia Plan Comments: (  Discussed general anesthesia, including possible nausea, instrumentation of airway, sore throat,pulmonary aspiration, etc. I asked if the were any outstanding questions, or  concerns before we proceeded. )        Anesthesia Quick Evaluation  

## 2013-07-22 NOTE — Interval H&P Note (Signed)
History and Physical Interval Note:  07/22/2013 8:32 AM  Tara LocksMadison M Detzel  has presented today for surgery, with the diagnosis of desires sterilization   The various methods of treatment have been discussed with the patient and family. After consideration of risks, benefits and other options for treatment, the patient has consented to  Procedure(s): LAPAROSCOPIC TUBAL LIGATION (Bilateral) as a surgical intervention .  The patient's history has been reviewed, patient examined, no change in status, stable for surgery.  I have reviewed the patient's chart and labs.  Questions were answered to the patient's satisfaction.     JACKSON-MOORE,Selby Slovacek A

## 2013-07-22 NOTE — Anesthesia Procedure Notes (Signed)
Procedure Name: Intubation Date/Time: 07/22/2013 9:25 AM Performed by: Eara Burruel, Jannet AskewHARLESETTA M Pre-anesthesia Checklist: Patient identified, Patient being monitored, Emergency Drugs available, Timeout performed and Suction available Patient Re-evaluated:Patient Re-evaluated prior to inductionOxygen Delivery Method: Circle system utilized Preoxygenation: Pre-oxygenation with 100% oxygen Intubation Type: IV induction Ventilation: Mask ventilation without difficulty Laryngoscope Size: Mac and 3 Grade View: Grade I Tube size: 7.0 mm Number of attempts: 1 Placement Confirmation: ETT inserted through vocal cords under direct vision,  breath sounds checked- equal and bilateral and positive ETCO2 Secured at: 22 cm Dental Injury: Teeth and Oropharynx as per pre-operative assessment

## 2013-07-22 NOTE — Op Note (Signed)
Procedure Note  Doristine LocksMadison M Edmister 28 y.o. 07/22/2013  Preoperative Diagnosis:  Multiparity, desires a sterilization procedure  Postoperative Diagnosis: Same  Procedure: Laparoscopic bilateral tubal ligation with fulguration  Surgeon: Antionette CharJACKSON-MOORE,Shaun Runyon A  Indications:  The patient now presents for a sterilization procedure after discussing therapeutic alternatives.        Procedure Detail:  The patient was taken to the operating room and was placed on the operating table in the dorsal supine position.  After his satisfactory general anesthesia was achieved, the patient was placed in the semi-lithotomy position using Allen stirrups. The patient was prepped and draped in the usual sterile manner for vaginal laparoscopic procedure. A speculum was placed in the vagina. The anterior lip of the cervix was grasped with a single-tooth tenaculum. A Hulka manipulator was then advanced into the uterus and secured  to the anterior lip of the cervix as a means to manipulate the uterus. The single-tooth tenaculum and Hulka manipulator were then removed. The infraumbilical region was then anesthetized with local anesthesia, 0.25%  Marcaine. A small incision was made to the skin and subcutaneous tissue. A 10 mm Optiview trocar was placed through the incision into the abdominal cavity diagnostic laparoscope with video camera attached was placed through the trocar sleeve and carbon dioxide was used to insufflate the abdominal and pelvic cavity. The pelvic contents were examined and the findings were described below. Kleppinger bipolar forceps were inserted through the operative port of the laparoscope. The left fallopian tube was identified and traced out to its fimbriated end. Then starting at the distal isthmus the proximal ampullary portion of the tube was coagulated in 4 contiguous areas. Each time the resistance meter went to 0 and the tube had been retracted away from an adjacent viscera. The right fallopian tube  was then manipulated in a similar fashion. The scope was removed and the excess carbon dioxide was remove through the port, before it was removed.  The subcutaneous layer of the incision was reapproximated with an interrupted figure-of eight 0-Vicryl suture on a UR 6 needle. A skin adhesive was applied. The instruments were removed from the vagina and there was minimal bleeding from the cervix. Final sponge, instrument and needle counts were correct. The patient was awakened on the operating table and taken to the PACU in satisfactory condition.    Findings: Normal pelvic viscera  Estimated Blood Loss:  Minimal              Total IV Fluids: per Anesthesiology        Condition: stable

## 2013-07-22 NOTE — Discharge Instructions (Signed)
DO NOT TAKE IBUPROFEN (ADVIL, ALEVE OR MOTRIN) TILL AFTER 4 PM!  Laparoscopy for Tubal Ligation Care After  Laparoscopic tubal ligation is an operation done with a long, lighted tube inserted through a small cut (incision) in the abdomen. The fallopian tubes are blocked by tying, clamping with a plastic clamp, or burning them closed with an electrocautery. Read the instructions outlined below and refer to this sheet in the next few weeks. These instructions provide you with general information on caring for yourself after you leave the hospital. Your caregiver may also give you specific instructions. While your treatment has been planned according to the most current medical practices available, unavoidable complications may occur. If you have any problems or questions after discharge, please call your caregiver.  HOME CARE INSTRUCTIONS  It will be normal to be sore for a couple days following surgery.   Take your medicine and follow the instructions from your caregiver.   You may resume usual diet, exercise, driving and activities as allowed by your caregiver.   Do not have sexual intercourse until your caregiver gives you permission.   Do not drive while taking pain medicine.   Avoid lifting until you are instructed otherwise.   Use showers for bathing, until you are seen by your caregiver.   Change dressings if needed, and as directed.   Only take over-the-counter or prescription medicines for pain, discomfort or fever as directed by your caregiver.   Do not take aspirin because it can cause bleeding.   Take your temperature twice a day and record it.   Have someone stay with you the day you have the operation, and for a couple days afterward.   Make an appointment to see your caregiver for stitches (sutures) or staple removal and postoperative exams, as instructed.   Continue your current contraceptive method until your next menses  SEEK MEDICAL CARE IF:  There is redness,  swelling, or increasing pain in a wound.   There is drainage from a wound lasting longer than one day.   Your pain is getting worse.   You develop a rash.   You are having a reaction to your medicine.   You become dizzy or lightheaded.   You need stronger medicine or a change in your pain medicine.   You notice a foul smell coming from a wound or dressing.   There is a breaking open of a wound after the stitches, staples, or skin adhesive strips have been removed.   You develop constipation.   SEEK IMMEDIATE MEDICAL CARE IF:  You have an oral temperature above 100.6, not controlled by medicine.   There is increasing abdominal pain.   You develop pain in your shoulders (shoulder strap areas) which becomes more severe. Some pain is common, because of the gas inserted into your abdomen during the procedure.   You develop bleeding or drainage from the suture sites or vagina (birth canal) following surgery.   You pass out.   You develop shortness of breath or difficulty breathing.   You develop chest or leg pain.   You develop persistent nausea, vomiting or diarrhea.   MAKE SURE YOU:   Understand these instructions.   Watch your condition.   Get help right away if you are not doing well or get worse.   Document Released: 08/16/2004 Document Re-Released: 07/17/2009

## 2013-07-22 NOTE — Transfer of Care (Signed)
Immediate Anesthesia Transfer of Care Note  Patient: Tara Thompson  Procedure(s) Performed: Procedure(s): LAPAROSCOPIC TUBAL LIGATION (Bilateral)  Patient Location: PACU  Anesthesia Type:General  Level of Consciousness: awake, alert  and oriented  Airway & Oxygen Therapy: Patient Spontanous Breathing and Patient connected to nasal cannula oxygen  Post-op Assessment: Report given to PACU RN and Post -op Vital signs reviewed and stable  Post vital signs: Reviewed and stable  Complications: No apparent anesthesia complications
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
## 2013-07-22 NOTE — Anesthesia Postprocedure Evaluation (Signed)
  Anesthesia Post-op Note  Patient: Tara Thompson  Procedure(s) Performed: Procedure(s): LAPAROSCOPIC TUBAL LIGATION (Bilateral) Patient is awake and responsive. Pain and nausea are reasonably well controlled. Vital signs are stable and clinically acceptable. Oxygen saturation is clinically acceptable. There are no apparent anesthetic complications at this time. Patient is ready for discharge.

## 2013-07-25 ENCOUNTER — Encounter (HOSPITAL_COMMUNITY): Payer: Self-pay | Admitting: Obstetrics & Gynecology

## 2013-09-07 ENCOUNTER — Encounter: Payer: Self-pay | Admitting: Obstetrics & Gynecology

## 2013-09-07 ENCOUNTER — Ambulatory Visit (INDEPENDENT_AMBULATORY_CARE_PROVIDER_SITE_OTHER): Payer: Medicaid Other | Admitting: Obstetrics & Gynecology

## 2013-09-07 VITALS — BP 102/71 | HR 76 | Temp 98.2°F | Ht 60.0 in | Wt 89.0 lb

## 2013-09-07 DIAGNOSIS — Z09 Encounter for follow-up examination after completed treatment for conditions other than malignant neoplasm: Secondary | ICD-10-CM

## 2013-09-12 NOTE — Progress Notes (Signed)
Subjective:     Tara Thompson is a 28 y.o. female who presents to the clinic 6 weeks status post  a laparoscopic BTL for requested sterilization. Eating a regular diet without difficulty. Bowel movements are normal. The patient is not having any pain.  The following portions of the patient's history were reviewed and updated as appropriate: allergies, current medications, past family history, past medical history, past social history, past surgical history and problem list.  Review of Systems Pertinent items are noted in HPI.    Objective:    BP 102/71  Pulse 76  Temp(Src) 98.2 F (36.8 C)  Ht 5' (1.524 m)  Wt 40.37 kg (89 lb)  BMI 17.38 kg/m2 General:  alert  Abdomen: soft, bowel sounds active, non-tender  Incision:   healing well, no drainage, no erythema, no hernia, no seroma, no swelling, no dehiscence, incision well approximated    Pelvic: vagina without discharge; uterus anteverted, NT; adnexa without masses, NT   Assessment:    Doing well postoperatively. Operative findings again reviewed.   Plan:    Continue any current medications. Follow up prn

## 2013-11-24 ENCOUNTER — Other Ambulatory Visit: Payer: Medicaid Other | Admitting: Family Medicine

## 2013-11-24 ENCOUNTER — Other Ambulatory Visit (HOSPITAL_COMMUNITY)
Admission: RE | Admit: 2013-11-24 | Discharge: 2013-11-24 | Disposition: A | Discharge status: Home / Self Care | Payer: Medicaid Other | Source: Ambulatory Visit | Attending: Family Medicine | Admitting: Family Medicine

## 2013-11-24 ENCOUNTER — Other Ambulatory Visit: Payer: Self-pay | Admitting: Family Medicine

## 2013-11-24 DIAGNOSIS — Z113 Encounter for screening for infections with a predominantly sexual mode of transmission: Secondary | ICD-10-CM | POA: Insufficient documentation

## 2013-11-24 DIAGNOSIS — Z1151 Encounter for screening for human papillomavirus (HPV): Secondary | ICD-10-CM | POA: Diagnosis present

## 2013-11-24 DIAGNOSIS — N760 Acute vaginitis: Secondary | ICD-10-CM | POA: Insufficient documentation

## 2013-11-24 DIAGNOSIS — Z01411 Encounter for gynecological examination (general) (routine) with abnormal findings: Secondary | ICD-10-CM | POA: Insufficient documentation

## 2013-11-25 LAB — CYTOLOGY - PAP

## 2013-11-28 LAB — URINE CYTOLOGY ANCILLARY ONLY
BACTERIAL VAGINITIS: POSITIVE — AB
Bacterial vaginitis: POSITIVE — AB
Bacterial vaginitis: POSITIVE — AB
Bacterial vaginitis: POSITIVE — AB
CANDIDA VAGINITIS: NEGATIVE
Chlamydia: NEGATIVE
Neisseria Gonorrhea: NEGATIVE
Trichomonas: NEGATIVE

## 2013-12-12 ENCOUNTER — Encounter: Payer: Self-pay | Admitting: Obstetrics & Gynecology

## 2014-01-11 ENCOUNTER — Ambulatory Visit: Payer: Medicaid Other | Admitting: Obstetrics & Gynecology

## 2014-02-06 ENCOUNTER — Encounter: Payer: Self-pay | Admitting: *Deleted

## 2014-02-06 ENCOUNTER — Ambulatory Visit: Payer: Medicaid Other | Admitting: Obstetrics & Gynecology

## 2014-02-07 ENCOUNTER — Encounter: Payer: Self-pay | Admitting: Obstetrics & Gynecology

## 2014-03-02 ENCOUNTER — Inpatient Hospital Stay (HOSPITAL_COMMUNITY)
Admission: AD | Admit: 2014-03-02 | Discharge: 2014-03-08 | DRG: 885 | Disposition: A | Discharge status: Home / Self Care | Payer: Medicaid Other | Source: Intra-hospital | Attending: Psychiatry | Admitting: Psychiatry

## 2014-03-02 ENCOUNTER — Ambulatory Visit (HOSPITAL_COMMUNITY)
Admission: RE | Admit: 2014-03-02 | Discharge: 2014-03-02 | Disposition: A | Discharge status: Home / Self Care | Payer: Medicaid Other | Source: Home / Self Care | Attending: Psychiatry | Admitting: Psychiatry

## 2014-03-02 DIAGNOSIS — F314 Bipolar disorder, current episode depressed, severe, without psychotic features: Secondary | ICD-10-CM | POA: Diagnosis present

## 2014-03-02 DIAGNOSIS — G4700 Insomnia, unspecified: Secondary | ICD-10-CM | POA: Diagnosis present

## 2014-03-02 DIAGNOSIS — Z809 Family history of malignant neoplasm, unspecified: Secondary | ICD-10-CM | POA: Diagnosis not present

## 2014-03-02 DIAGNOSIS — F419 Anxiety disorder, unspecified: Secondary | ICD-10-CM | POA: Diagnosis present

## 2014-03-02 DIAGNOSIS — F17210 Nicotine dependence, cigarettes, uncomplicated: Secondary | ICD-10-CM | POA: Diagnosis present

## 2014-03-02 DIAGNOSIS — Z9114 Patient's other noncompliance with medication regimen: Secondary | ICD-10-CM | POA: Diagnosis present

## 2014-03-02 DIAGNOSIS — F1210 Cannabis abuse, uncomplicated: Secondary | ICD-10-CM | POA: Insufficient documentation

## 2014-03-03 ENCOUNTER — Encounter (HOSPITAL_COMMUNITY): Payer: Self-pay | Admitting: *Deleted

## 2014-03-03 DIAGNOSIS — F314 Bipolar disorder, current episode depressed, severe, without psychotic features: Secondary | ICD-10-CM | POA: Diagnosis present

## 2014-03-03 LAB — CBC WITH DIFFERENTIAL/PLATELET
BASOS ABS: 0.1 10*3/uL (ref 0.0–0.1)
BASOS PCT: 1 % (ref 0–1)
EOS ABS: 0.1 10*3/uL (ref 0.0–0.7)
Eosinophils Relative: 1 % (ref 0–5)
HEMATOCRIT: 40 % (ref 36.0–46.0)
HEMOGLOBIN: 13.6 g/dL (ref 12.0–15.0)
LYMPHS PCT: 34 % (ref 12–46)
Lymphs Abs: 3.3 10*3/uL (ref 0.7–4.0)
MCH: 30.4 pg (ref 26.0–34.0)
MCHC: 34 g/dL (ref 30.0–36.0)
MCV: 89.3 fL (ref 78.0–100.0)
MONO ABS: 0.7 10*3/uL (ref 0.1–1.0)
Monocytes Relative: 7 % (ref 3–12)
NEUTROS ABS: 5.5 10*3/uL (ref 1.7–7.7)
NEUTROS PCT: 57 % (ref 43–77)
Platelets: 384 10*3/uL (ref 150–400)
RBC: 4.48 MIL/uL (ref 3.87–5.11)
RDW: 12.3 % (ref 11.5–15.5)
WBC: 9.6 10*3/uL (ref 4.0–10.5)

## 2014-03-03 LAB — COMPREHENSIVE METABOLIC PANEL
ALBUMIN: 4.8 g/dL (ref 3.5–5.2)
ALK PHOS: 73 U/L (ref 39–117)
ALT: 10 U/L (ref 0–35)
ANION GAP: 11 (ref 5–15)
AST: 16 U/L (ref 0–37)
BILIRUBIN TOTAL: 1.7 mg/dL — AB (ref 0.3–1.2)
BUN: 6 mg/dL (ref 6–23)
CALCIUM: 9.4 mg/dL (ref 8.4–10.5)
CO2: 24 mmol/L (ref 19–32)
CREATININE: 0.6 mg/dL (ref 0.50–1.10)
Chloride: 104 mmol/L (ref 96–112)
GFR calc non Af Amer: >90 mL/min (ref >90)
GLUCOSE: 106 mg/dL — AB (ref 70–99)
POTASSIUM: 3.6 mmol/L (ref 3.5–5.1)
SODIUM: 139 mmol/L (ref 135–145)
Total Protein: 7.6 g/dL (ref 6.0–8.3)

## 2014-03-03 LAB — ETHANOL: Alcohol, Ethyl (B): <5 mg/dL (ref 0–9)

## 2014-03-03 MED ORDER — HALOPERIDOL 5 MG PO TABS
5.0000 mg | ORAL_TABLET | Freq: Four times a day (QID) | ORAL | Status: DC | PRN
  Administered 2014-03-03: 5 mg via ORAL
  Filled 2014-03-03: qty 1

## 2014-03-03 MED ORDER — BENZTROPINE MESYLATE 0.5 MG PO TABS
0.5000 mg | ORAL_TABLET | ORAL | Status: DC
  Administered 2014-03-03 – 2014-03-08 (×10): 0.5 mg via ORAL
  Filled 2014-03-03 (×10): qty 1
  Filled 2014-03-03: qty 14
  Filled 2014-03-03 (×2): qty 1
  Filled 2014-03-03: qty 14
  Filled 2014-03-03: qty 1

## 2014-03-03 MED ORDER — NICOTINE 21 MG/24HR TD PT24
21.0000 mg | MEDICATED_PATCH | Freq: Every day | TRANSDERMAL | Status: DC
  Filled 2014-03-03 (×4): qty 1

## 2014-03-03 MED ORDER — BENZTROPINE MESYLATE 1 MG PO TABS: 1.0000 mg | ORAL_TABLET | Freq: Four times a day (QID) | ORAL | Status: DC | PRN

## 2014-03-03 MED ORDER — GABAPENTIN 100 MG PO CAPS
100.0000 mg | ORAL_CAPSULE | ORAL | Status: DC
  Administered 2014-03-03 – 2014-03-08 (×16): 100 mg via ORAL
  Filled 2014-03-03 (×3): qty 1
  Filled 2014-03-03: qty 42
  Filled 2014-03-03 (×7): qty 1
  Filled 2014-03-03: qty 42
  Filled 2014-03-03 (×7): qty 1
  Filled 2014-03-03: qty 42
  Filled 2014-03-03: qty 1

## 2014-03-03 MED ORDER — RISPERIDONE 1 MG PO TABS
1.0000 mg | ORAL_TABLET | Freq: Every day | ORAL | Status: AC
  Administered 2014-03-03: 1 mg via ORAL
  Filled 2014-03-03 (×2): qty 1

## 2014-03-03 MED ORDER — DIVALPROEX SODIUM 500 MG PO DR TAB
500.0000 mg | DELAYED_RELEASE_TABLET | ORAL | Status: DC
  Administered 2014-03-03 – 2014-03-07 (×8): 500 mg via ORAL
  Filled 2014-03-03 (×12): qty 1

## 2014-03-03 MED ORDER — ZIPRASIDONE MESYLATE 20 MG IM SOLR: 20.0000 mg | INTRAMUSCULAR | Status: DC | PRN

## 2014-03-03 MED ORDER — MAGNESIUM HYDROXIDE 400 MG/5ML PO SUSP: 30.0000 mL | Freq: Every day | ORAL | Status: DC | PRN

## 2014-03-03 MED ORDER — BENZTROPINE MESYLATE 0.5 MG PO TABS
0.5000 mg | ORAL_TABLET | Freq: Every day | ORAL | Status: DC
  Filled 2014-03-03 (×2): qty 1

## 2014-03-03 MED ORDER — ACETAMINOPHEN 325 MG PO TABS
650.0000 mg | ORAL_TABLET | Freq: Four times a day (QID) | ORAL | Status: DC | PRN
  Filled 2014-03-03: qty 2

## 2014-03-03 MED ORDER — ALUM & MAG HYDROXIDE-SIMETH 200-200-20 MG/5ML PO SUSP
30.0000 mL | ORAL | Status: DC | PRN
Start: 2014-03-03 — End: 2014-03-08

## 2014-03-03 MED ORDER — LORAZEPAM 1 MG PO TABS
1.0000 mg | ORAL_TABLET | ORAL | Status: AC | PRN
  Administered 2014-03-03: 1 mg via ORAL
  Filled 2014-03-03: qty 1

## 2014-03-03 MED ORDER — RISPERIDONE 2 MG PO TBDP: 2.0000 mg | ORAL_TABLET | Freq: Three times a day (TID) | ORAL | Status: DC | PRN

## 2014-03-03 MED ORDER — OLANZAPINE 2.5 MG PO TABS
2.5000 mg | ORAL_TABLET | ORAL | Status: DC
  Administered 2014-03-03 – 2014-03-08 (×10): 2.5 mg via ORAL
  Filled 2014-03-03 (×2): qty 1
  Filled 2014-03-03: qty 14
  Filled 2014-03-03 (×4): qty 1
  Filled 2014-03-03: qty 14
  Filled 2014-03-03 (×6): qty 1

## 2014-03-03 MED ORDER — TRAZODONE HCL 50 MG PO TABS
50.0000 mg | ORAL_TABLET | Freq: Every day | ORAL | Status: DC
  Administered 2014-03-04 – 2014-03-07 (×4): 50 mg via ORAL
  Filled 2014-03-03: qty 7
  Filled 2014-03-03 (×6): qty 1

## 2014-03-03 MED ORDER — TRAZODONE HCL 50 MG PO TABS
50.0000 mg | ORAL_TABLET | Freq: Every evening | ORAL | Status: AC | PRN
  Administered 2014-03-03: 50 mg via ORAL
  Filled 2014-03-03: qty 1
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
## 2014-03-03 NOTE — Progress Notes (Signed)
Client is a 29 yo female admitted c/o of "feeling a little bit off" "taking medicine off and on" "feeling manic" "bipolar depression" client laughing inappropriately during admission, fidgety, and suspicious. Client denies SHI. Reportedly client has not been sleeping or eating, fixated on 435 yo daughter thinking someone is out to hurt her and feeling that food had been poisoned. Writer reviewed admission forms, encouraged client to notify staff with any concerns. Staff will monitor q5215min for safety. Client is safe on the unit.

## 2014-03-03 NOTE — [facility] Suicide Risk Assessment (Signed)
[name]   Nursing information obtained from:  Patient Demographic factors:  Adolescent or young adult, Caucasian, Unemployed Current Mental Status:  NA Loss Factors:  Financial problems / change in socioeconomic status Historical Factors:  NA Risk Reduction Factors:  Living with another person, especially a relative, Positive social support Total Time spent with patient: 30 minutes Principal Problem: Bipolar 1 disorder, depressed, severe Diagnosis:   Patient Active Problem List   Diagnosis Date Noted  . Bipolar 1 disorder, depressed, severe [F31.4] 03/03/2014  . Encounter for female sterilization procedure [Z30.2] 07/22/2013  . Depression [F32.9] 08/25/2012  . Chronic pelvic pain in female [N94.9, G89.29] 04/07/2012     Continued Clinical Symptoms:  Alcohol Use Disorder Identification Test Final Score (AUDIT): 0 The "Alcohol Use Disorders Identification Test", Guidelines for Use in Primary Care, Second Edition.  World Science writerHealth Organization [facility](WHO). Score between 0-7:  no or low risk or alcohol related problems. Score between 8-15:  moderate risk of alcohol related problems. Score between 16-19:  high risk of alcohol related problems. Score 20 or above:  warrants further diagnostic evaluation for alcohol dependence and treatment.   CLINICAL FACTORS:   Bipolar Disorder:   Depressive phase   Musculoskeletal: Strength & Muscle Tone: within normal limits Gait & Station: normal Patient leans: N/A  Psychiatric Specialty Exam: Physical Exam  Review of Systems  Constitutional: Negative.   HENT: Negative.   Eyes: Negative.   Respiratory: Negative.   Cardiovascular: Negative.   Gastrointestinal: Negative.   Genitourinary: Negative.   Musculoskeletal: Negative.   Skin: Negative.   Neurological: Negative.   Psychiatric/Behavioral: Positive for depression. The patient is nervous/anxious.     Blood pressure 110/68, pulse 89, temperature 98.6 F (37  C), temperature source Oral, resp. rate 18, height 5' (1.524 m), weight 41.731 kg (92 lb).Body mass index is 17.97 kg/(m^2).  General Appearance: Fairly Groomed  Patent attorneyye Contact::  Fair  Speech:  Normal Rate  Volume:  Decreased  Mood:  Anxious and Depressed  Affect:  Labile  Thought Process:  Disorganized and Irrelevant  Orientation:  Full (Time, Place, and Person)  Thought Content:  Paranoid Ideation and Rumination  Suicidal Thoughts:  No  Homicidal Thoughts:  No  Memory:  Immediate;   Fair Recent;   Fair Remote;   Fair  Judgement:  Impaired  Insight:  Lacking  Psychomotor Activity:  Restlessness  Concentration:  Poor  Recall:  FiservFair  Fund of Knowledge:Fair  Language: Fair  Akathisia:  No  Handed:  Right  AIMS (if indicated):     Assets:  Desire for Improvement Social Support  Sleep:  Number of Hours: 3  Cognition: WNL  ADL's:  Intact     COGNITIVE FEATURES THAT CONTRIBUTE TO RISK:  Polarized thinking    SUICIDE RISK:   Moderate:  Frequent suicidal ideation with limited intensity, and duration, some specificity in terms of plans, no associated intent, good self-control, limited dysphoria/symptomatology, some risk factors present, and identifiable protective factors, including available and accessible social support.  PLAN OF CARE: Patient with hx of Bipolar disorder ,presented with depression ,delusions as well as paranoia which has been progressively getting worse since the past several weeks. Patient to be started on Depakote as well as Zyprexa for mood lability and psycosis. Will add Gabapentin for anxiety sx .Patient with hx of bad side effects to Risperdal and Vistaril.   Medical Decision Making:  Review of Psycho-Social Stressors (1), Review or order clinical lab tests (1), Review and summation  of old records (2), Established Problem, Worsening (2), New Problem, with no additional work-up planned (3), Review of Last Therapy Session (1), Review of Medication Regimen &  Side Effects (2) and Review of New Medication or Change in Dosage (2)  I certify that inpatient services furnished can reasonably be expected to improve the patient's condition.   Tara Kathan MD 03/03/2014, 12:41 PM
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
## 2014-03-03 NOTE — Progress Notes (Signed)
D) Pt has been in her room. Has not attended any of the groups today. Slept late but when up has a paranoid flavor to her behavior. Checks the packages of medications thoroughly. Did state that she needed something for anxiety and was given Haldol 5 mg. It was explained that it helps to calm people down and also to help with her thinking and Pt was ok with that. Rates her depression at a 3, her hopelessness at a 4 and her anxiety at a 5. Denies SI and HI A) Given support and reassurance along with praise. Encouragement given. Provided with a 1:1. Education provided.  R) Denies SI and HI.
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
## 2014-03-03 NOTE — Tx Team (Addendum)
Initial Interdisciplinary Treatment Plan   PATIENT STRESSORS: Medication change or noncompliance   PATIENT STRENGTHS: Communication skills General fund of knowledge   PROBLEM LIST: Problem List/Patient Goals Date to be addressed Date deferred Reason deferred Estimated date of resolution  "feeling a little bit off" 03-02-14     "feeling manic" 03-02-14     "bipolar depression" 03-02-14     "off & on medications" 03-02-14     Depression 03-03-14     Suicidal Ideations 03-03-14     Psychosis 03-03-14                  DISCHARGE CRITERIA:  Improved stabilization in mood, thinking, and/or behavior Need for constant or close observation no longer present Verbal commitment to aftercare and medication compliance  PRELIMINARY DISCHARGE PLAN: Attend aftercare/continuing care group Participate in family therapy  PATIENT/FAMIILY INVOLVEMENT: This treatment plan has been presented to and reviewed with the patient, Tara Thompson, and/or family member.  The patient and family have been given the opportunity to ask questions and make suggestions.  Tara Thompson, Tara Thompson 03/03/2014, 12:46 AM

## 2014-03-04 LAB — RAPID URINE DRUG SCREEN, HOSP PERFORMED
Amphetamines: NOT DETECTED
Barbiturates: NOT DETECTED
Benzodiazepines: NOT DETECTED
COCAINE: NOT DETECTED
Opiates: NOT DETECTED
TETRAHYDROCANNABINOL: POSITIVE — AB

## 2014-03-04 LAB — HEMOGLOBIN A1C
Hgb A1c MFr Bld: 5.4 % (ref <5.7)
Mean Plasma Glucose: 108 mg/dL (ref <117)

## 2014-03-04 LAB — PREGNANCY, URINE: Preg Test, Ur: NEGATIVE

## 2014-03-04 LAB — COMPREHENSIVE METABOLIC PANEL
ALBUMIN: 3.9 g/dL (ref 3.5–5.2)
ALT: 9 U/L (ref 0–35)
AST: 12 U/L (ref 0–37)
Alkaline Phosphatase: 61 U/L (ref 39–117)
Anion gap: 10 (ref 5–15)
BUN: 9 mg/dL (ref 6–23)
CO2: 25 mmol/L (ref 19–32)
Calcium: 8.5 mg/dL (ref 8.4–10.5)
Chloride: 102 mmol/L (ref 96–112)
Creatinine, Ser: 0.56 mg/dL (ref 0.50–1.10)
GFR calc non Af Amer: >90 mL/min (ref >90)
Glucose, Bld: 90 mg/dL (ref 70–99)
Potassium: 3.7 mmol/L (ref 3.5–5.1)
Sodium: 137 mmol/L (ref 135–145)
TOTAL PROTEIN: 6.5 g/dL (ref 6.0–8.3)
Total Bilirubin: 1 mg/dL (ref 0.3–1.2)

## 2014-03-04 LAB — URINALYSIS, ROUTINE W REFLEX MICROSCOPIC
Bilirubin Urine: NEGATIVE
GLUCOSE, UA: NEGATIVE mg/dL
Hgb urine dipstick: NEGATIVE
KETONES UR: NEGATIVE mg/dL
Leukocytes, UA: NEGATIVE
Nitrite: NEGATIVE
PH: 7.5 (ref 5.0–8.0)
Protein, ur: NEGATIVE mg/dL
Specific Gravity, Urine: 1.013 (ref 1.005–1.030)
Urobilinogen, UA: 0.2 mg/dL (ref 0.0–1.0)

## 2014-03-04 LAB — LIPID PANEL
CHOLESTEROL: 143 mg/dL (ref 0–200)
HDL: 50 mg/dL (ref >39)
LDL Cholesterol: 83 mg/dL (ref 0–99)
Total CHOL/HDL Ratio: 2.9 RATIO
Triglycerides: 50 mg/dL (ref <150)
VLDL: 10 mg/dL (ref 0–40)

## 2014-03-04 LAB — TSH: TSH: 0.789 u[IU]/mL (ref 0.350–4.500)

## 2014-03-04 NOTE — Plan of Care (Signed)
Problem: Alteration in thought process Goal: STG-Patient is able to sleep at least 6 hours per night Outcome: Progressing Pt refuse trazodone stating he does not need help to sleep. Pt sleeping most of the evening

## 2014-03-04 NOTE — Progress Notes (Signed)
D: Patient in room on approach. Pt sleeping most of the evening with little interaction with peers. Pt seemed irritable when awoken to attend group. Pt denies SI/HI/AVH and pain. Cooperative with assessment. No acute distressed noted at this time.   A: Met with pt 1:1. Medications administered as prescribed. Writer encouraged pt to discuss feelings. Pt encouraged to come to staff with any question or concerns.   R: Patient remains safe. She is complaint with medications and denies any adverse reaction.    

## 2014-03-04 NOTE — Progress Notes (Signed)
D) Pt is interacting with her peers and attending the program. Affect and mood are within normal limits. Pt denies SI and HI. Rates her depression at a 1, her hopelessness at a 0 and her anxiety at a 0. Interacting with her roommate appropriately. States she wants to get her medications right so that she can be a good parent to her child A) Given support, reassurance and praise. Encouraged to work on her packet and to attend the groups. R) Denies SI and HI. Denies delusions and hallucinations

## 2014-03-04 NOTE — [facility] Group Notes (Signed)
[facility] LCSW Group Therapy Note  03/04/2014 / 10:30 AM  Type of Therapy and Topic:  Group Therapy: Avoiding Self-Sabotaging and Enabling Behaviors  Participation Level:  Active  Therapeutic Goals: 1. Patient will identify one obstacle that relates to self-sabotage and enabling behaviors 2. Patient will identify one personal self-sabotaging or enabling behavior they did prior to admission 3. Patient able to establish a plan to change the above identified behavior they did prior to admission:  4. Patient will demonstrate ability to communicate their needs through discussion and/or role plays.   Summary of Patient Progress: The main focus of today's process group was to discuss what "self-sabotage" means and use Motivational Interviewing to discuss what benefits, negative or positive, were involved in a self-identified self-sabotaging behavior. We then talked about reasons the patient may want to change the behavior and current desire to change. The patient shared that she self sabotaged in the past with self harm yet was able process she did this in order to avoid harming her mother. Patient states her main goal this year is to reestablish contact with daughter. Patient related to discussion as evidenced by her eye contact and comments on discussion.   Therapeutic Modalities:   Cognitive Behavioral Therapy Person-Centered Therapy Motivational Interviewing   Carney Bernatherine C Harrill, LCSW

## 2014-03-04 NOTE — Progress Notes (Signed)
.  Psychoeducational Group Note    Date: 03/04/2014 Time: 1000    Goal Setting Purpose of Group: To be able to set a goal that is measurable and that can be accomplished in one day Participation Level:  Active  Participation Quality:  Appropriate  Affect:  Flat   Cognitive:  Disorganized  Insight:  Limited  Engagement in Group:  Limited  Additional Comments:  Pt attended the group, paid attention and did provide feedback for a goal. Not engaged in the group.  Dione HousekeeperJudge, Jalani Cullifer A

## 2014-03-05 NOTE — Progress Notes (Signed)
[facility] MD Progress Note  03/05/2014 9:54 AM Tara Thompson  MRN:  [id number] Subjective:  "I am good today" Objective:  Patient was seen today.  Patient appears in good spirits and joining in group therapy.  She is interacting well with others.  Denies SI/HI/AVH.   Principal Problem: Bipolar 1 disorder, depressed, severe Diagnosis:   Patient Active Problem List   Diagnosis Date Noted  . Bipolar 1 disorder, depressed, severe [F31.4] 03/03/2014  . Encounter for female sterilization procedure [Z30.2] 07/22/2013  . Depression [F32.9] 08/25/2012  . Chronic pelvic pain in female [N94.9, G89.29] 04/07/2012   Total Time spent with patient: 30 minutes  Past Medical History:  Past Medical History  Diagnosis Date  . Ovarian cyst   . Abnormal Pap smear     LSIL>normal follow-up pap  . Anxiety   . Mental disorder   . Depression   . Bipolar disorder     Past Surgical History  Procedure Laterality Date  . Colposcopy    . Wisdom tooth extraction  age 81  . Laparoscopic tubal ligation Bilateral 07/22/2013    Procedure: LAPAROSCOPIC TUBAL LIGATION;  Surgeon: Lahoma Crocker, MD;  Location: [facility];  Service: Gynecology;  Laterality: Bilateral;   Family History:  Family History  Problem Relation Age of Onset  . Cancer Paternal Grandmother   . Depression Maternal Uncle   . Bipolar disorder Maternal Grandmother   . Depression Paternal Aunt   . Drug abuse Brother    Social History:  History  Alcohol Use No    Comment: occasional - one glass of wine per month     History  Drug Use No    Comment: Reports she quit THC early July 2014, used cocaine once in July of 2013    History   Social History  . Marital Status: Single    Spouse Name: N/A    Number of Children: N/A  . Years of Education: N/A   Social History Main Topics  . Smoking status: Current Every Day Smoker -- 0.50 packs/day for 11 years    Types: Cigarettes  . Smokeless tobacco: Never Used  . Alcohol Use: No   Comment: occasional - one glass of wine per month  . Drug Use: No     Comment: Reports she quit THC early July 2014, used cocaine once in July of 2013  . Sexual Activity: Yes    Birth Control/ Protection: Condom, Surgical     Comment: Tubal Ligation    Other Topics Concern  . None   Social History Narrative   08/25/2012 AHW  Adine was born and grew up in [place], [place]. She has a younger brother. Her parents divorced when she was 88 years old. She reports that her childhood was "awful." She reports that she witnessed her parents have frequent verbal confrontations. She graduated from high school. 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 She has never been married. She has a 62-year-old daughter. She currently lives with her mother and her daughter. Her hobbies include reading and painting. She affiliates as a Engineer, manufacturing. She denies any legal difficulties. She reports that her social support system consists of her mother and father. 08/25/2012 AHW    Additional History:    Sleep: Good  Appetite:  Good  Assessment:  Tara Thompson is a 29 year old patient who is coming in with history of depression and bipolar mood disorder.  Musculoskeletal: Strength & Muscle  Tone: within normal limits Gait & Station: normal Patient leans: N/A   Psychiatric Specialty Exam: Physical Exam  Vitals reviewed.   Review of Systems  Constitutional: Negative.   HENT: Negative.   Eyes: Negative.   Respiratory: Negative.   Cardiovascular: Negative.   Gastrointestinal: Negative.   Genitourinary: Negative.   Musculoskeletal: Negative.   Skin: Negative.   Neurological: Negative.   Endo/Heme/Allergies: Negative.   Psychiatric/Behavioral: Negative for depression, suicidal ideas, hallucinations, memory loss and substance abuse. The patient is nervous/anxious. The patient does not have insomnia.     Blood pressure 105/49, pulse  76, temperature 98.5 F (36.9 C), temperature source Oral, resp. rate 16, height 5' (1.524 m), weight 41.731 kg (92 lb).Body mass index is 17.97 kg/(m^2).   General Appearance: Fairly Groomed  Engineer, water:: Good  Speech: Clear and Coherent  Volume: Normal  Mood: Euphoric  Affect: Labile  Thought Process: Disorganized  Orientation: Full (Time, Place, and Person)  Thought Content: Rumination  Suicidal Thoughts: No  Homicidal Thoughts: No  Memory: Immediate; Fair Recent; Fair Remote; Fair  Judgement: Fair  Insight: Fair  Psychomotor Activity: Normal  Concentration: Good  Recall: Good  Fund of Knowledge:Good  Language: Good  Akathisia: Negative  Handed: Right  AIMS (if indicated):    Assets: Communication Skills Physical Health Resilience Social Support  ADL's: Intact  Cognition: WNL  Sleep: Number of Hours: 3   Current Medications: Current Facility-Administered Medications  Medication Dose Route Frequency Provider Last Rate Last Dose  . acetaminophen (TYLENOL) tablet 650 mg  650 mg Oral Q6H PRN Ursula Alert, MD      . alum & mag hydroxide-simeth (MAALOX/MYLANTA) 200-200-20 MG/5ML suspension 30 mL  30 mL Oral Q4H PRN Saramma Eappen, MD      . OLANZapine (ZYPREXA) tablet 2.5 mg  2.5 mg Oral BH-qamhs Saramma Eappen, MD   2.5 mg at 03/05/14 5188   And  . benztropine (COGENTIN) tablet 0.5 mg  0.5 mg Oral BH-qamhs Saramma Eappen, MD   0.5 mg at 03/05/14 4166  . haloperidol (HALDOL) tablet 5 mg  5 mg Oral Q6H PRN Ursula Alert, MD   5 mg at 03/03/14 1400   And  . benztropine (COGENTIN) tablet 1 mg  1 mg Oral Q6H PRN Saramma Eappen, MD      . divalproex (DEPAKOTE) DR tablet 500 mg  500 mg Oral BH-qamhs Saramma Eappen, MD   500 mg at 03/05/14 0813  . gabapentin (NEURONTIN) capsule 100 mg  100 mg Oral [id number] Ursula Alert, MD   100 mg at 03/05/14 0814  . magnesium hydroxide (MILK OF MAGNESIA) suspension 30 mL  30 mL Oral  Daily PRN Ursula Alert, MD      . traZODone (DESYREL) tablet 50 mg  50 mg Oral QHS Ursula Alert, MD   50 mg at 03/04/14 2159    Lab Results:  Results for orders placed or performed during the hospital encounter of 03/02/14 (from the past 48 hour(s))  TSH     Status: None   Collection Time: 03/04/14  6:30 AM  Result Value Ref Range   TSH 0.789 0.350 - 4.500 uIU/mL    Comment: Performed at [facility]  Comprehensive metabolic panel     Status: None   Collection Time: 03/04/14  6:30 AM  Result Value Ref Range   Sodium 137 135 - 145 mmol/L   Potassium 3.7 3.5 - 5.1 mmol/L   Chloride 102 96 - 112 mmol/L   CO2 25 19 - 32  mmol/L   Glucose, Bld 90 70 - 99 mg/dL   BUN 9 6 - 23 mg/dL   Creatinine, Ser 0.56 0.50 - 1.10 mg/dL   Calcium 8.5 8.4 - 10.5 mg/dL   Total Protein 6.5 6.0 - 8.3 g/dL   Albumin 3.9 3.5 - 5.2 g/dL   AST 12 0 - 37 U/L   ALT 9 0 - 35 U/L   Alkaline Phosphatase 61 39 - 117 U/L   Total Bilirubin 1.0 0.3 - 1.2 mg/dL   GFR calc non Af Amer >90 >90 mL/min   GFR calc Af Amer >90 >90 mL/min    Comment: (NOTE) The eGFR has been calculated using the CKD EPI equation. This calculation has not been validated in all clinical situations. eGFR's persistently <90 mL/min signify possible Chronic Kidney Disease.    Anion gap 10 5 - 15    Comment: Performed at [facility]  Lipid panel     Status: None   Collection Time: 03/04/14  6:30 AM  Result Value Ref Range   Cholesterol 143 0 - 200 mg/dL   Triglycerides 50 <150 mg/dL   HDL 50 >39 mg/dL   Total CHOL/HDL Ratio 2.9 RATIO   VLDL 10 0 - 40 mg/dL   LDL Cholesterol 83 0 - 99 mg/dL    Comment:        Total Cholesterol/HDL:CHD Risk Coronary Heart Disease Risk Table                     Men   Women  1/2 Average Risk   3.4   3.3  Average Risk       5.0   4.4  2 X Average Risk   9.6   7.1  3 X Average Risk  23.4   11.0        Use the calculated Patient Ratio above and the CHD Risk Table to  determine the patient's CHD Risk.        ATP III CLASSIFICATION (LDL):  <100     mg/dL   Optimal  100-129  mg/dL   Near or Above                    Optimal  130-159  mg/dL   Borderline  160-189  mg/dL   High  >190     mg/dL   Very High Performed at [facility]   Hemoglobin A1c     Status: None   Collection Time: 03/04/14  6:30 AM  Result Value Ref Range   Hgb A1c MFr Bld 5.4 <5.7 %    Comment: (NOTE)                                                                       According to the ADA Clinical Practice Recommendations for 2011, when HbA1c is used as a screening test:  >=6.5%   Diagnostic of Diabetes Mellitus           (if abnormal result is confirmed) 5.7-6.4%   Increased risk of developing Diabetes Mellitus References:Diagnosis and Classification of Diabetes Mellitus,Diabetes KKXF,8182,99(BZJIR 1):S62-S69 and Standards of Medical Care in         Diabetes - 2011,Diabetes Care,2011,34 (Suppl 1):S11-S61.  Mean Plasma Glucose 108 <117 mg/dL    Comment: Performed at [facility]  Pregnancy, urine     Status: None   Collection Time: 03/04/14  8:22 PM  Result Value Ref Range   Preg Test, Ur NEGATIVE NEGATIVE    Comment:        THE SENSITIVITY OF THIS METHODOLOGY IS >20 mIU/mL. Performed at [facility]   Urine rapid drug screen (hosp performed)     Status: Abnormal   Collection Time: 03/04/14  8:22 PM  Result Value Ref Range   Opiates NONE DETECTED NONE DETECTED   Cocaine NONE DETECTED NONE DETECTED   Benzodiazepines NONE DETECTED NONE DETECTED   Amphetamines NONE DETECTED NONE DETECTED   Tetrahydrocannabinol POSITIVE (A) NONE DETECTED   Barbiturates NONE DETECTED NONE DETECTED    Comment:        DRUG SCREEN FOR MEDICAL PURPOSES ONLY.  IF CONFIRMATION IS NEEDED FOR ANY PURPOSE, NOTIFY LAB WITHIN 5 DAYS.        LOWEST DETECTABLE LIMITS FOR URINE DRUG SCREEN Drug Class       Cutoff (ng/mL) Amphetamine      1000 Barbiturate       200 Benzodiazepine   109 Tricyclics       323 Opiates          300 Cocaine          300 THC              50 Performed at [facility]   Urinalysis, Routine w reflex microscopic     Status: Abnormal   Collection Time: 03/04/14  8:22 PM  Result Value Ref Range   Color, Urine YELLOW YELLOW   APPearance CLOUDY (A) CLEAR   Specific Gravity, Urine 1.013 1.005 - 1.030   pH 7.5 5.0 - 8.0   Glucose, UA NEGATIVE NEGATIVE mg/dL   Hgb urine dipstick NEGATIVE NEGATIVE   Bilirubin Urine NEGATIVE NEGATIVE   Ketones, ur NEGATIVE NEGATIVE mg/dL   Protein, ur NEGATIVE NEGATIVE mg/dL   Urobilinogen, UA 0.2 0.0 - 1.0 mg/dL   Nitrite NEGATIVE NEGATIVE   Leukocytes, UA NEGATIVE NEGATIVE    Comment: MICROSCOPIC NOT DONE ON URINES WITH NEGATIVE PROTEIN, BLOOD, LEUKOCYTES, NITRITE, OR GLUCOSE <1000 mg/dL. Performed at [facility]    Physical Findings: AIMS: Facial and Oral Movements Muscles of Facial Expression: None, normal Lips and Perioral Area: None, normal Jaw: None, normal Tongue: None, normal,Extremity Movements Upper (arms, wrists, hands, fingers): None, normal Lower (legs, knees, ankles, toes): None, normal, Trunk Movements Neck, shoulders, hips: None, normal, Overall Severity Severity of abnormal movements (highest score from questions above): None, normal Incapacitation due to abnormal movements: None, normal Patient's awareness of abnormal movements (rate only patient's report): No Awareness, Dental Status Current problems with teeth and/or dentures?: No Does patient usually wear dentures?: No  CIWA:    COWS:     Treatment Plan Summary: Daily contact with patient to assess and evaluate symptoms and progress in treatment and Medication management Review of chart, vital signs, medications, and notes.  1-Individual and group therapy  2-Medication management for depression and anxiety: Medications reviewed with the patient and she stated no untoward  effects, unchanged.  3-Coping skills for depression, anxiety  4-Continue crisis stabilization and management  5-Address health issues--monitoring vital signs, stable  6-Treatment plan in progress to prevent relapse of depression and anxiety  Medical Decision Making:  Established Problem, Stable/Improving (1), Review of Psycho-Social Stressors (1), Decision to  obtain old records (1), Review of Last Therapy Session (1), Review or order medicine tests (1), Review of Medication Regimen & Side Effects (2) and Review of New Medication or Change in Dosage (2) Problem Points:  Established problem, stable/improving (1), New problem, with additional work-up planned (4) and Review of psycho-social stressors (1) Data Points:  Discuss tests with performing physician (1) Independent review of image, tracing, or specimen (2) Review and summation of old records (2) Review of medication regiment & side effects (2)  AGUSTIN, [place], AGNP-BC 03/05/2014, 9:54 AM  I agreed with the findings, treatment and disposition plan of this patient. Berniece Andreas, MD

## 2014-03-05 NOTE — [facility] Group Notes (Signed)
[facility] LCSW Group Therapy  03/05/2014   11:00 AM   Type of Therapy:  Group Therapy  Participation Level:  Active  Participation Quality:  Appropriate and Attentive  Affect:  Appropriate, Calm  Cognitive:  Alert and Appropriate  Insight:  Developing/Improving and Engaged  Engagement in Therapy:  Developing/Improving and Engaged  Modes of Intervention:  Clarification, Confrontation, Discussion, Education, Exploration, Limit-setting, Orientation, Problem-solving, Rapport Building, Dance movement psychotherapisteality Testing, Socialization and Support  Summary of Progress/Problems: The main focus of today's process group was to identify the patient's current support system and decide on other supports that can be put in place.  An emphasis was placed on using counselor, doctor, therapy groups, 12-step groups, and problem-specific support groups to expand supports, as well as doing something different than has been done before.  Pt shared that she feels she has no support and can only rely on herself.  Pt gave various examples of when she tried to reach out but feels the help is held over her head, or not offered with the best intentions.  Pt discussed not being open to therapy or support groups, but after further group discussion was open to trying support groups.  Pt actively participated and was engaged in group discussion.     Reyes IvanChelsea Horton, LCSW 03/05/2014 12:41 PM

## 2014-03-05 NOTE — Progress Notes (Signed)
D) Pt has been attending the groups and interacting with her peers. Getting along well with her roommate. Pt states she is happy that she came here and is getting her medications straight. Feels hopeful that she will be able to function well outside of the hospital. Rates her depression at a 0, hopelessness at a 2 and her anxiety at a 2. A) Given support, reassurance and praise. Encouragement given. Provided with a 1:1. R) Denies SI and HI

## 2014-03-05 NOTE — Progress Notes (Signed)
Patient ID: Tara Thompson, female   DOB: Oct 30, 1985, 29 y.o.   MRN: [id number] [facility] MD Progress Note  03/05/2014 2:23 PM Tara Thompson  MRN:  [id number] Subjective:  "I am good today" Objective:  Patient was seen today.  Patient appears in good spirits and joining in group therapy.  She is interacting well with others.  She states feeling much better on the meds she is on.  She does not want to to go to her mom's house.  She feels that this would be detrimental to health both physical and mental.  Denies SI/HI/AVH.   Principal Problem: Bipolar 1 disorder, depressed, severe Diagnosis:   Patient Active Problem List   Diagnosis Date Noted  . Bipolar 1 disorder, depressed, severe [F31.4] 03/03/2014  . Encounter for female sterilization procedure [Z30.2] 07/22/2013  . Depression [F32.9] 08/25/2012  . Chronic pelvic pain in female [N94.9, G89.29] 04/07/2012   Total Time spent with patient: 30 minutes  Past Medical History:  Past Medical History  Diagnosis Date  . Ovarian cyst   . Abnormal Pap smear     LSIL>normal follow-up pap  . Anxiety   . Mental disorder   . Depression   . Bipolar disorder     Past Surgical History  Procedure Laterality Date  . Colposcopy    . Wisdom tooth extraction  age 29  . Laparoscopic tubal ligation Bilateral 07/22/2013    Procedure: LAPAROSCOPIC TUBAL LIGATION;  Surgeon: Lahoma Crocker, MD;  Location: [facility];  Service: Gynecology;  Laterality: Bilateral;   Family History:  Family History  Problem Relation Age of Onset  . Cancer Paternal Grandmother   . Depression Maternal Uncle   . Bipolar disorder Maternal Grandmother   . Depression Paternal Aunt   . Drug abuse Brother    Social History:  History  Alcohol Use No    Comment: occasional - one glass of wine per month     History  Drug Use No    Comment: Reports she quit THC early July 2014, used cocaine once in July of 2013    History   Social History  . Marital Status: Single   Spouse Name: N/A    Number of Children: N/A  . Years of Education: N/A   Social History Main Topics  . Smoking status: Current Every Day Smoker -- 0.50 packs/day for 11 years    Types: Cigarettes  . Smokeless tobacco: Never Used  . Alcohol Use: No     Comment: occasional - one glass of wine per month  . Drug Use: No     Comment: Reports she quit THC early July 2014, used cocaine once in July of 2013  . Sexual Activity: Yes    Birth Control/ Protection: Condom, Surgical     Comment: Tubal Ligation    Other Topics Concern  . None   Social History Narrative   08/25/2012 [facility]  Tara Thompson was born and grew up in [place], [place]. She has a younger brother. Her parents divorced when she was 23 years old. She reports that her childhood was "awful." She reports that she witnessed her parents have frequent verbal confrontations. She graduated from high school. 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 She has never been married. She has a 80-year-old daughter. She currently lives with her mother and her daughter. Her hobbies include reading and painting. She affiliates as a Engineer, manufacturing. She denies any legal difficulties. She reports  that her social support system consists of her mother and father. 08/25/2012 A[facility]    Additional History:    Sleep: Good  Appetite:  Good  Assessment:  Tara Thompson is a 29 year old patient who is coming in with history of depression and bipolar mood disorder.  Musculoskeletal: Strength & Muscle Tone: within normal limits Gait & Station: normal Patient leans: N/A   Psychiatric Specialty Exam: Physical Exam  Vitals reviewed.   ROS  Blood pressure 105/49, pulse 76, temperature 98.5 F (36.9 C), temperature source Oral, resp. rate 16, height 5' (1.524 m), weight 41.731 kg (92 lb).Body mass index is 17.97 kg/(m^2).   General Appearance: Fairly Groomed  Engineer, water:: Good  Speech:  Clear and Coherent  Volume: Normal  Mood: Euphoric  Affect: Labile  Thought Process: Disorganized  Orientation: Full (Time, Place, and Person)  Thought Content: Rumination  Suicidal Thoughts: No  Homicidal Thoughts: No  Memory: Immediate; Fair Recent; Fair Remote; Fair  Judgement: Fair  Insight: Fair  Psychomotor Activity: Normal  Concentration: Good  Recall: Good  Fund of Knowledge:Good  Language: Good  Akathisia: Negative  Handed: Right  AIMS (if indicated):    Assets: Communication Skills Physical Health Resilience Social Support  ADL's: Intact  Cognition: WNL  Sleep: Number of Hours: 3   Current Medications: Current Facility-Administered Medications  Medication Dose Route Frequency Provider Last Rate Last Dose  . acetaminophen (TYLENOL) tablet 650 mg  650 mg Oral Q6H PRN Ursula Alert, MD      . alum & mag hydroxide-simeth (MAALOX/MYLANTA) 200-200-20 MG/5ML suspension 30 mL  30 mL Oral Q4H PRN Saramma Eappen, MD      . OLANZapine (ZYPREXA) tablet 2.5 mg  2.5 mg Oral BH-qamhs Saramma Eappen, MD   2.5 mg at 03/05/14 3817   And  . benztropine (COGENTIN) tablet 0.5 mg  0.5 mg Oral BH-qamhs Saramma Eappen, MD   0.5 mg at 03/05/14 7116  . haloperidol (HALDOL) tablet 5 mg  5 mg Oral Q6H PRN Ursula Alert, MD   5 mg at 03/03/14 1400   And  . benztropine (COGENTIN) tablet 1 mg  1 mg Oral Q6H PRN Saramma Eappen, MD      . divalproex (DEPAKOTE) DR tablet 500 mg  500 mg Oral BH-qamhs Saramma Eappen, MD   500 mg at 03/05/14 0813  . gabapentin (NEURONTIN) capsule 100 mg  100 mg Oral [id number] Ursula Alert, MD   100 mg at 03/05/14 0814  . magnesium hydroxide (MILK OF MAGNESIA) suspension 30 mL  30 mL Oral Daily PRN Ursula Alert, MD      . traZODone (DESYREL) tablet 50 mg  50 mg Oral QHS Ursula Alert, MD   50 mg at 03/04/14 2159    Lab Results:  Results for orders placed or performed during the hospital encounter of  03/02/14 (from the past 48 hour(s))  TSH     Status: None   Collection Time: 03/04/14  6:30 AM  Result Value Ref Range   TSH 0.789 0.350 - 4.500 uIU/mL    Comment: Performed at [facility]  Comprehensive metabolic panel     Status: None   Collection Time: 03/04/14  6:30 AM  Result Value Ref Range   Sodium 137 135 - 145 mmol/L   Potassium 3.7 3.5 - 5.1 mmol/L   Chloride 102 96 - 112 mmol/L   CO2 25 19 - 32 mmol/L   Glucose, Bld 90 70 - 99 mg/dL   BUN 9 6 - 23  mg/dL   Creatinine, Ser 0.56 0.50 - 1.10 mg/dL   Calcium 8.5 8.4 - 10.5 mg/dL   Total Protein 6.5 6.0 - 8.3 g/dL   Albumin 3.9 3.5 - 5.2 g/dL   AST 12 0 - 37 U/L   ALT 9 0 - 35 U/L   Alkaline Phosphatase 61 39 - 117 U/L   Total Bilirubin 1.0 0.3 - 1.2 mg/dL   GFR calc non Af Amer >90 >90 mL/min   GFR calc Af Amer >90 >90 mL/min    Comment: (NOTE) The eGFR has been calculated using the CKD EPI equation. This calculation has not been validated in all clinical situations. eGFR's persistently <90 mL/min signify possible Chronic Kidney Disease.    Anion gap 10 5 - 15    Comment: Performed at [facility]  Lipid panel     Status: None   Collection Time: 03/04/14  6:30 AM  Result Value Ref Range   Cholesterol 143 0 - 200 mg/dL   Triglycerides 50 <150 mg/dL   HDL 50 >39 mg/dL   Total CHOL/HDL Ratio 2.9 RATIO   VLDL 10 0 - 40 mg/dL   LDL Cholesterol 83 0 - 99 mg/dL    Comment:        Total Cholesterol/HDL:CHD Risk Coronary Heart Disease Risk Table                     Men   Women  1/2 Average Risk   3.4   3.3  Average Risk       5.0   4.4  2 X Average Risk   9.6   7.1  3 X Average Risk  23.4   11.0        Use the calculated Patient Ratio above and the CHD Risk Table to determine the patient's CHD Risk.        ATP III CLASSIFICATION (LDL):  <100     mg/dL   Optimal  100-129  mg/dL   Near or Above                    Optimal  130-159  mg/dL   Borderline  160-189  mg/dL   High  >190      mg/dL   Very High Performed at [facility]   Hemoglobin A1c     Status: None   Collection Time: 03/04/14  6:30 AM  Result Value Ref Range   Hgb A1c MFr Bld 5.4 <5.7 %    Comment: (NOTE)                                                                       According to the ADA Clinical Practice Recommendations for 2011, when HbA1c is used as a screening test:  >=6.5%   Diagnostic of Diabetes Mellitus           (if abnormal result is confirmed) 5.7-6.4%   Increased risk of developing Diabetes Mellitus References:Diagnosis and Classification of Diabetes Mellitus,Diabetes XBDZ,3299,24(QASTM 1):S62-S69 and Standards of Medical Care in         Diabetes - 2011,Diabetes Care,2011,34 (Suppl 1):S11-S61.    Mean Plasma Glucose 108 <117 mg/dL    Comment: Performed at [facility]  Pregnancy, urine     Status: None   Collection Time: 03/04/14  8:22 PM  Result Value Ref Range   Preg Test, Ur NEGATIVE NEGATIVE    Comment:        THE SENSITIVITY OF THIS METHODOLOGY IS >20 mIU/mL. Performed at [facility]   Urine rapid drug screen (hosp performed)     Status: Abnormal   Collection Time: 03/04/14  8:22 PM  Result Value Ref Range   Opiates NONE DETECTED NONE DETECTED   Cocaine NONE DETECTED NONE DETECTED   Benzodiazepines NONE DETECTED NONE DETECTED   Amphetamines NONE DETECTED NONE DETECTED   Tetrahydrocannabinol POSITIVE (A) NONE DETECTED   Barbiturates NONE DETECTED NONE DETECTED    Comment:        DRUG SCREEN FOR MEDICAL PURPOSES ONLY.  IF CONFIRMATION IS NEEDED FOR ANY PURPOSE, NOTIFY LAB WITHIN 5 DAYS.        LOWEST DETECTABLE LIMITS FOR URINE DRUG SCREEN Drug Class       Cutoff (ng/mL) Amphetamine      1000 Barbiturate      200 Benzodiazepine   407 Tricyclics       680 Opiates          300 Cocaine          300 THC              50 Performed at [facility]   Urinalysis, Routine w reflex microscopic     Status: Abnormal    Collection Time: 03/04/14  8:22 PM  Result Value Ref Range   Color, Urine YELLOW YELLOW   APPearance CLOUDY (A) CLEAR   Specific Gravity, Urine 1.013 1.005 - 1.030   pH 7.5 5.0 - 8.0   Glucose, UA NEGATIVE NEGATIVE mg/dL   Hgb urine dipstick NEGATIVE NEGATIVE   Bilirubin Urine NEGATIVE NEGATIVE   Ketones, ur NEGATIVE NEGATIVE mg/dL   Protein, ur NEGATIVE NEGATIVE mg/dL   Urobilinogen, UA 0.2 0.0 - 1.0 mg/dL   Nitrite NEGATIVE NEGATIVE   Leukocytes, UA NEGATIVE NEGATIVE    Comment: MICROSCOPIC NOT DONE ON URINES WITH NEGATIVE PROTEIN, BLOOD, LEUKOCYTES, NITRITE, OR GLUCOSE <1000 mg/dL. Performed at [facility]    Physical Findings: AIMS: Facial and Oral Movements Muscles of Facial Expression: None, normal Lips and Perioral Area: None, normal Jaw: None, normal Tongue: None, normal,Extremity Movements Upper (arms, wrists, hands, fingers): None, normal Lower (legs, knees, ankles, toes): None, normal, Trunk Movements Neck, shoulders, hips: None, normal, Overall Severity Severity of abnormal movements (highest score from questions above): None, normal Incapacitation due to abnormal movements: None, normal Patient's awareness of abnormal movements (rate only patient's report): No Awareness, Dental Status Current problems with teeth and/or dentures?: No Does patient usually wear dentures?: No  CIWA:    COWS:     Treatment Plan Summary: Daily contact with patient to assess and evaluate symptoms and progress in treatment and Medication management Review of chart, vital signs, medications, and notes.  1-Individual and group therapy  2-Medication management for depression and anxiety: Medications reviewed with the patient and she stated no untoward effects, unchanged.  3-Coping skills for depression, anxiety  4-Continue crisis stabilization and management  5-Address health issues--monitoring vital signs, stable  6-Treatment plan in progress to prevent relapse of  depression and anxiety  Medical Decision Making:  Established Problem, Stable/Improving (1), Review of Psycho-Social Stressors (1), Decision to obtain old records (1), Review of Last Therapy Session (1), Review or order medicine  tests (1), Review of Medication Regimen & Side Effects (2) and Review of New Medication or Change in Dosage (2) Problem Points:  Established problem, stable/improving (1), New problem, with additional work-up planned (4) and Review of psycho-social stressors (1) Data Points:  Discuss tests with performing physician (1) Independent review of image, tracing, or specimen (2) Review and summation of old records (2) Review of medication regiment & side effects (2)  AGUSTIN, [place], AGNP-BC 03/05/2014, 2:23 PM  I agreed with the findings, treatment and disposition plan of this patient. Berniece Andreas, MD

## 2014-03-05 NOTE — Progress Notes (Signed)
D: Patient pleasant on approach. Pt engaged in groups and interacting with staff and peers. Pt upset after phone call with parents insisting parents are trying to manipulate her life and control her daughter.   A: Met with pt 1:1. Medications administered as prescribed. Writer encouraged pt to discuss feelings. Pt encouraged to come to staff with any question or concerns.   R: Patient remains safe. She is complaint with medications and denies any adverse reaction. Pt denies SI/HI/AVH.

## 2014-03-05 NOTE — [facility] Group Notes (Signed)
[facility] Group Notes:  (Nursing/MHT/Case Management/Adjunct)  Date:  03/05/2014  Time:  1500pm  Type of Therapy:  Therapeutic Ball Activity  Participation Level:  Active  Participation Quality:  Appropriate and Attentive  Affect:  Appropriate  Cognitive:  Alert and Appropriate  Insight:  Appropriate and Good  Engagement in Group:  Engaged  Modes of Intervention:  Activity  Summary of Progress/Problems: Patient attended group and responded appropriately during activity.  Lendell CapriceGuthrie, Harden Bramer A 03/05/2014, 3:43 PM

## 2014-03-06 DIAGNOSIS — F1290 Cannabis use, unspecified, uncomplicated: Secondary | ICD-10-CM

## 2014-03-06 NOTE — Progress Notes (Signed)
  DAR: Pt. Denies SI/HI and A/V Hallucinations to this Clinical research associatewriter. Patient reports her sleep was poor last night, appetite is fair, energy level is high, and concentration level is good. Patient rates her depression at 1/10, hopelessness at 2/10, and anxiety level at 4/10 for the day. Patient does not report any pain or discomfort at this time. Support and encouragement provided to the patient to come to writer with questions or concerns. Scheduled medications administered to patient per physician's orders. Patient is minimal with Clinical research associatewriter however is notably irritated about another patient on the hall this morning. On her daily inventory sheet she writes, "Please shut this B up. I cannot stand her."  Patient does remain calm in the milieu though. Patient is seen interacting with peers and is attending groups. Q15 minute checks are maintained for safety.

## 2014-03-06 NOTE — [facility] Group Notes (Signed)
[facility]   03/06/2014 10:08 AM  Participation Quality:  Engaged  Mood/Affect:  Appropriate  Depression Rating:  denies  Anxiety Rating:  denies  Thoughts of Suicide:  No Will you contract for safety?   NA  Current AVH:  No  Plan for Discharge/Comments:  Admits she went off of her medication prior to admission here.  "My thoughts were all jumbled, but it's better now."  Was able to name all her medications.  States she used to see Dr Betti Cruzeddy, but he was no longer affordable.  Would like a referral to another agency for medication management.  Nos gins nor symptoms of psychosis/paranoia today.  Transportation Means: family  Supports: family  Kiribatiorth, Baldo DaubRodney B

## 2014-03-06 NOTE — Progress Notes (Signed)
Patient ID: Tara Thompson, female   DOB: September 23, 1985, 29 y.o.   MRN: [id number] [facility] MD Progress Note  03/06/2014 3:00 PM Tara Thompson  MRN:  [id number] Subjective:  "I am feeling a lot better today.'  Objective:  Patient was seen today.  Patient today appears to have a bright affect ,denies paranoia ,or delusions. Reports sleep as better. Denies any mood lability . Reports overall improvement on medications. Denies SI/HI/AH/VH. Patient had UDS positive for cannabis. Its likely that this could also be a contributing factor for her current decompensation.  Per staff patient has improved ,no disruptive issues noted on the unit.      Principal Problem: Bipolar 1 disorder, depressed, severe Diagnosis:   Primary Psychiatric Diagnosis: Bipolar disorder ,type I depressed ,severe with psychosis   Secondary Psychiatric Diagnosis: Cannabis use disorder ,unspecified   Non Psychiatric Diagnosis:    Patient Active Problem List   Diagnosis Date Noted  . Bipolar 1 disorder, depressed, severe [F31.4] 03/03/2014  . Encounter for female sterilization procedure [Z30.2] 07/22/2013  . Depression [F32.9] 08/25/2012  . Chronic pelvic pain in female [N94.9, G89.29] 04/07/2012   Total Time spent with patient: 30 minutes  Past Medical History:  Past Medical History  Diagnosis Date  . Ovarian cyst   . Abnormal Pap smear     LSIL>normal follow-up pap  . Anxiety   . Mental disorder   . Depression   . Bipolar disorder     Past Surgical History  Procedure Laterality Date  . Colposcopy    . Wisdom tooth extraction  age 20  . Laparoscopic tubal ligation Bilateral 07/22/2013    Procedure: LAPAROSCOPIC TUBAL LIGATION;  Surgeon: Antionette Char, MD;  Location: [facility];  Service: Gynecology;  Laterality: Bilateral;   Family History:  Family History  Problem Relation Age of Onset  . Cancer Paternal Grandmother   . Depression Maternal Uncle   . Bipolar disorder Maternal Grandmother   .  Depression Paternal Aunt   . Drug abuse Brother    Social History:  History  Alcohol Use No    Comment: occasional - one glass of wine per month     History  Drug Use No    Comment: Reports she quit THC early July 2014, used cocaine once in July of 2013    History   Social History  . Marital Status: Single    Spouse Name: N/A    Number of Children: N/A  . Years of Education: N/A   Social History Main Topics  . Smoking status: Current Every Day Smoker -- 0.50 packs/day for 11 years    Types: Cigarettes  . Smokeless tobacco: Never Used  . Alcohol Use: No     Comment: occasional - one glass of wine per month  . Drug Use: No     Comment: Reports she quit THC early July 2014, used cocaine once in July of 2013  . Sexual Activity: Yes    Birth Control/ Protection: Condom, Surgical     Comment: Tubal Ligation    Other Topics Concern  . None   Social History Narrative   08/25/2012 AHW  Adriel was born and grew up in [place], [place]. She has a younger brother. Her parents divorced when she was 4 years old. She reports that her childhood was "awful." She reports that she witnessed her parents have frequent verbal confrontations. She graduated from high school. 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 She has never been married. She has a 48-year-old daughter. She currently lives with her mother and her daughter. Her hobbies include reading and painting. She affiliates as a Curator. She denies any legal difficulties. She reports that her social support system consists of her mother and father. 08/25/2012 AHW    Additional History:    Sleep: Good  Appetite:  Good    Musculoskeletal: Strength & Muscle Tone: within normal limits Gait & Station: normal Patient leans: N/A   Psychiatric Specialty Exam: Physical Exam  Vitals reviewed.   Review of Systems  Constitutional: Negative.   HENT:  Negative.   Respiratory: Negative.   Cardiovascular: Negative.   Skin: Negative.   Psychiatric/Behavioral: Positive for depression and hallucinations (improving). The patient has insomnia (improving).     Blood pressure 94/57, pulse 93, temperature 98.4 F (36.9 C), temperature source Oral, resp. rate 16, height 5' (1.524 m), weight 41.731 kg (92 lb).Body mass index is 17.97 kg/(m^2).   General Appearance: Fairly Groomed  Patent attorney:: Good  Speech: Clear and Coherent  Volume: Normal  Mood: Euphoric  Affect: Labile  Thought Process: linear  Orientation: Full (Time, Place, and Person)  Thought Content: Rumination  Suicidal Thoughts: No  Homicidal Thoughts: No  Memory: Immediate; Fair Recent; Fair Remote; Fair  Judgement: Fair  Insight: Fair  Psychomotor Activity: Normal  Concentration: Good  Recall: Good  Fund of Knowledge:Good  Language: Good  Akathisia: Negative  Handed: Right  AIMS (if indicated):    Assets: Communication Skills Physical Health Resilience Social Support  ADL's: Intact  Cognition: WNL  Sleep: Number of Hours: 6.25   Current Medications: Current Facility-Administered Medications  Medication Dose Route Frequency Provider Last Rate Last Dose  . acetaminophen (TYLENOL) tablet 650 mg  650 mg Oral Q6H PRN Jomarie Longs, MD      . alum & mag hydroxide-simeth (MAALOX/MYLANTA) 200-200-20 MG/5ML suspension 30 mL  30 mL Oral Q4H PRN Laretta Pyatt, MD      . OLANZapine (ZYPREXA) tablet 2.5 mg  2.5 mg Oral BH-qamhs Dhwani Venkatesh, MD   2.5 mg at 03/06/14 0858   And  . benztropine (COGENTIN) tablet 0.5 mg  0.5 mg Oral BH-qamhs Avonelle Viveros, MD   0.5 mg at 03/06/14 0858  . haloperidol (HALDOL) tablet 5 mg  5 mg Oral Q6H PRN Jomarie Longs, MD   5 mg at 03/03/14 1400   And  . benztropine (COGENTIN) tablet 1 mg  1 mg Oral Q6H PRN Galit Urich, MD      . divalproex (DEPAKOTE) DR tablet 500 mg  500 mg Oral  BH-qamhs Burton Gahan, MD   500 mg at 03/06/14 0858  . gabapentin (NEURONTIN) capsule 100 mg  100 mg Oral [id number] Kross Swallows, MD   100 mg at 03/06/14 1452  . magnesium hydroxide (MILK OF MAGNESIA) suspension 30 mL  30 mL Oral Daily PRN Jomarie Longs, MD      . traZODone (DESYREL) tablet 50 mg  50 mg Oral QHS Jomarie Longs, MD   50 mg at 03/05/14 2114    Lab Results:  Results for orders placed or performed during the hospital encounter of 03/02/14 (from the past 48 hour(s))  Pregnancy, urine     Status: None   Collection Time: 03/04/14  8:22 PM  Result Value Ref Range   Preg Test, Ur NEGATIVE NEGATIVE    Comment:        THE SENSITIVITY OF THIS METHODOLOGY IS >20 mIU/mL. Performed at [facility]   Urine rapid drug screen (hosp performed)     Status: Abnormal   Collection Time: 03/04/14  8:22 PM  Result Value Ref Range   Opiates NONE DETECTED NONE DETECTED   Cocaine NONE DETECTED NONE DETECTED   Benzodiazepines NONE DETECTED NONE DETECTED   Amphetamines NONE DETECTED NONE DETECTED   Tetrahydrocannabinol POSITIVE (A) NONE DETECTED   Barbiturates NONE DETECTED NONE DETECTED    Comment:        DRUG SCREEN FOR MEDICAL PURPOSES ONLY.  IF CONFIRMATION IS NEEDED FOR ANY PURPOSE, NOTIFY LAB WITHIN 5 DAYS.        LOWEST DETECTABLE LIMITS FOR URINE DRUG SCREEN Drug Class       Cutoff (ng/mL) Amphetamine      1000 Barbiturate      200 Benzodiazepine   200 Tricyclics       300 Opiates          300 Cocaine          300 THC              50 Performed at [facility]   Urinalysis, Routine w reflex microscopic     Status: Abnormal   Collection Time: 03/04/14  8:22 PM  Result Value Ref Range   Color, Urine YELLOW YELLOW   APPearance CLOUDY (A) CLEAR   Specific Gravity, Urine 1.013 1.005 - 1.030   pH 7.5 5.0 - 8.0   Glucose, UA NEGATIVE NEGATIVE mg/dL   Hgb urine dipstick NEGATIVE NEGATIVE   Bilirubin Urine NEGATIVE NEGATIVE   Ketones,  ur NEGATIVE NEGATIVE mg/dL   Protein, ur NEGATIVE NEGATIVE mg/dL   Urobilinogen, UA 0.2 0.0 - 1.0 mg/dL   Nitrite NEGATIVE NEGATIVE   Leukocytes, UA NEGATIVE NEGATIVE    Comment: MICROSCOPIC NOT DONE ON URINES WITH NEGATIVE PROTEIN, BLOOD, LEUKOCYTES, NITRITE, OR GLUCOSE <1000 mg/dL. Performed at [facility]    Physical Findings: AIMS: Facial and Oral Movements Muscles of Facial Expression: None, normal Lips and Perioral Area: None, normal Jaw: None, normal Tongue: None, normal,Extremity Movements Upper (arms, wrists, hands, fingers): None, normal Lower (legs, knees, ankles, toes): None, normal, Trunk Movements Neck, shoulders, hips: None, normal, Overall Severity Severity of abnormal movements (highest score from questions above): None, normal Incapacitation due to abnormal movements: None, normal Patient's awareness of abnormal movements (rate only patient's report): No Awareness, Dental Status Current problems with teeth and/or dentures?: No Does patient usually wear dentures?: No  CIWA:    COWS:     Assessment; Patient is a [age] y ol CF with past hx of Bipolar disorder ,presented with paranoia . Patient has been showing response to current medication regimen.   Treatment Plan Summary: Daily contact with patient to assess and evaluate symptoms and progress in treatment and Medication management Review of chart, vital signs, medications, and notes.  1-Individual and group therapy   2-Medication management for affective sx.   Will continue Zyprexa 2.5 mg po bid along with Cogentin 0.5 mg po bid .Denies side effects.   Will continue Depakote DR 500 mg po bid for mood lability. Will get Depakote level tomorrow AM.   Patient is also positive for Cannabis . Patient will benefit from a substance abuse program once discharged.   3-Coping skills for depression, anxiety  4-Continue crisis stabilization and management  5-Address health issues--monitoring vital  signs, stable  6-Treatment plan in progress to prevent relapse of depression and anxiety  Medical Decision Making:  Established Problem, Stable/Improving (  1), Review of Psycho-Social Stressors (1), Decision to obtain old records (1), Review of Last Therapy Session (1), Review or order medicine tests (1), Review of Medication Regimen & Side Effects (2) and Review of New Medication or Change in Dosage (2) Problem Points:  Established problem, stable/improving (1), New problem, with no additional work-up planned (3) and Review of psycho-social stressors (1) Data Points:  Review and summation of old records (2) Review of medication regiment & side effects (2)  Haruto Demaria MD 03/06/2014, 3:00 PM

## 2014-03-06 NOTE — Progress Notes (Signed)
D: "give me everything I can have, I need to be knocked out". Patient reports she has not slept well for couple days. Pt reports she has resolved issues with her mother. Pt engaged in groups and interacting with staff and peers.    A: Met with pt 1:1. Medications administered as prescribed. Writer encouraged pt to discuss feelings. Pt encouraged to come to staff with any question or concerns.   R: Patient remains safe. She is complaint with medications and denies any adverse reaction. Pt denies SI/HI/AVH and pain.    

## 2014-03-06 NOTE — [facility] Suicide Risk Assessment (Signed)
[facility] INPATIENT:  Family/Significant Other Suicide Prevention Education  Suicide Prevention Education:  Education Completed; Tara Thompson, mother, [phone] has been identified by the patient as the family member/significant other with whom the patient will be residing, and identified as the person(s) who will aid the patient in the event of a mental health crisis (suicidal ideations/suicide attempt).  With written consent from the patient, the family member/significant other has been provided the following suicide prevention education, prior to the and/or following the discharge of the patient.  The suicide prevention education provided includes the following:  Suicide risk factors  Suicide prevention and interventions  National Suicide Hotline telephone number  [facility] assessment telephone number  [facility] Emergency Assistance 911  [facility] and/or Residential Mobile Crisis Unit telephone number  Request made of family/significant other to:  Remove weapons (e.g., guns, rifles, knives), all items previously/currently identified as safety concern.    Remove drugs/medications (over-the-counter, prescriptions, illicit drugs), all items previously/currently identified as a safety concern.  The family member/significant other verbalizes understanding of the suicide prevention education information provided.  The family member/significant other agrees to remove the items of safety concern listed above.  Ida Rogueorth, Sharyn Brilliant B 03/06/2014, 3:49 PM

## 2014-03-06 NOTE — [facility] Group Notes (Signed)
[facility] LCSW Group Therapy  03/06/2014 1:15 pm  Type of Therapy: Process Group Therapy  Participation Level:  Active  Participation Quality:  Appropriate  Affect:  Flat  Cognitive:  Oriented  Insight:  Improving  Engagement in Group:  Limited  Engagement in Therapy:  Limited  Modes of Intervention:  Activity, Clarification, Education, Problem-solving and Support  Summary of Progress/Problems: Today's group addressed the issue of overcoming obstacles.  Patients were asked to identify their biggest obstacle post d/c that stands in the way of their on-going success, and then problem solve as to how to manage this.  Tara Thompson talked about overcoming her addiction to alcohol.  "I just stopped.  I guess I grew out of it.  I was just being stupid."  Of course, did not mention that she smokes weed on a daily basis, and unable to identify any current obstacles other than "my mother.  She thinks she knows everything."  Tara Thompson, Tara Thompson 03/06/2014   3:21 PM

## 2014-03-06 NOTE — Plan of Care (Signed)
Problem: Alteration in mood Goal: STG-Patient reports thoughts of self-harm to staff Outcome: Completed/Met Date Met:  03/06/14 Patient is able to come to staff to report thoughts. Pt. currently denies thoughts of self harm.

## 2014-03-06 NOTE — Progress Notes (Signed)
D. Pt had been up and visible in milieu this evening, attending and participating in various milieu activities. Pt spoke about how she was doing fine and how her day was doing pretty good today. Pt reports that her medications are working appropriately for her and did not verbalize any complaints. Pt did receive all medications without incident. A. Support and encouragement provided. R. Safety maintained, will continue to monitor.

## 2014-03-06 NOTE — Progress Notes (Signed)
Adult Psychoeducational Group Note  Date:  03/06/2014 Time:  9:22 PM  Group Topic/Focus:  Wrap-Up Group:   The focus of this group is to help patients review their daily goal of treatment and discuss progress on daily workbooks.  Participation Level:  Active  Participation Quality:  Sharing  Affect:  Appropriate  Cognitive:  Alert  Insight: Good  Engagement in Group:  Engaged  Modes of Intervention:  Discussion  Additional Comments:  Patient stated that she had a good day and that the food here was great. Patient says that her goal is to get some sleep tonight.  Percell LocusJOHNSON,TAWANA 03/06/2014, 9:22 PM

## 2014-03-07 LAB — VALPROIC ACID LEVEL: VALPROIC ACID LVL: 132.9 ug/mL — AB (ref 50.0–100.0)

## 2014-03-07 MED ORDER — DIVALPROEX SODIUM 250 MG PO DR TAB
250.0000 mg | DELAYED_RELEASE_TABLET | ORAL | Status: DC
  Filled 2014-03-07 (×2): qty 1

## 2014-03-07 MED ORDER — DIVALPROEX SODIUM 250 MG PO DR TAB
250.0000 mg | DELAYED_RELEASE_TABLET | ORAL | Status: DC
  Filled 2014-03-07 (×4): qty 1

## 2014-03-07 NOTE — Progress Notes (Signed)
D: Pt reports she is doing well and ready for discharge tomorrow. Pt rated mood and affect as 0 on a 0-10 scale. Pt observed in dayroom engaging in groups and interacting well with peers.   A: Met with pt 1:1. Medications administered as prescribed. Valproic acid level explained and Depakote held awaiting repeat blood draw in the morning. Writer encouraged pt to discuss feelings. Pt encouraged to come to staff with any question or concerns.   R: Patient remains safe. She is complaint with medications and denies any adverse reaction. Pt denies SI/HI/AVH and pain.

## 2014-03-07 NOTE — [facility] Group Notes (Signed)
[facility] Group Notes:  (Counselor/Nursing/MHT/Case Management/Adjunct)  03/07/2014 1:15PM  Type of Therapy:  Group Therapy  Participation Level:  Active  Participation Quality:  Appropriate  Affect:  Flat  Cognitive:  Oriented  Insight:  Improving  Engagement in Group:  Limited  Engagement in Therapy:  Limited  Modes of Intervention:  Discussion, Exploration and Socialization  Summary of Progress/Problems: The topic for group was balance in life.  Pt participated in the discussion about when their life was in balance and out of balance and how this feels.  Pt discussed ways to get back in balance and short term goals they can work on to get where they want to be.  Tara Thompson admitted to pushing others away.  "I grew up in such a toxic environment in my family that I did not learn how to successfully negotiate relationships."  Went on to talk about how the struggle will be figuring out how to pass on a more functional message, both by teaching and modeling, to her 28 YO daughter.  Identified resilience of her stubbornness, though admitted "it can go either way" and her willingness to ask for help.  Tara Thompson, Tara Thompson 03/07/2014 2:47 PM

## 2014-03-07 NOTE — Progress Notes (Signed)
D: Patient presents with anxious affect and mood. She reported on the self inventory sheet that his sleep and appetite are both poor and energy level is normal. Patient rates depression "4" and feelings of hopelessness/anxiety "0". She reported "feeling great today" and in good spirits. Patient has been attending groups and conversing with peers in the dayroom. Compliant with current medication regimen.  A: Support and encouragement provided to patient. Scheduled medications given per MD orders. Maintain Q15 minute checks for safety.  R: Patient receptive. Denies SI/HI and auditory/visual hallucinations. Patient remains safe on the hall.

## 2014-03-07 NOTE — Progress Notes (Addendum)
Patient ID: Tara Thompson, female   DOB: Jul 18, 1985, 29 y.o.   MRN: [id number] [facility] MD Progress Note  03/07/2014 1:16 PM Tara Thompson  MRN:  [id number] Subjective:  "I am feeling a better."  Objective:  Patient was seen today.  Patient today appears to be less anxious . Patient reports sleep and appetite as better. Patient was asked about her cannabis abuse , for which she reported occasional use. Discussed with patient about the adverse effects of cannabis and what interaction it may have with her medications as well as her mood. Patient voices understanding. Denies SI/HI/AH/VH.  Patient has an elevated Depakote level. Will reduce her depakote dose. Will repeat labs tomorrow AM.  Per staff patient has improved ,no disruptive issues noted on the unit.      Principal Problem: Bipolar 1 disorder, depressed, severe Diagnosis:   Primary Psychiatric Diagnosis: Bipolar disorder ,type I depressed ,severe with psychosis   Secondary Psychiatric Diagnosis: Cannabis use disorder ,mild   Non Psychiatric Diagnosis:    Patient Active Problem List   Diagnosis Date Noted  . Bipolar 1 disorder, depressed, severe [F31.4] 03/03/2014  . Encounter for female sterilization procedure [Z30.2] 07/22/2013  . Depression [F32.9] 08/25/2012  . Chronic pelvic pain in female [N94.9, G89.29] 04/07/2012   Total Time spent with patient: 30 minutes  Past Medical History:  Past Medical History  Diagnosis Date  . Ovarian cyst   . Abnormal Pap smear     LSIL>normal follow-up pap  . Anxiety   . Mental disorder   . Depression   . Bipolar disorder     Past Surgical History  Procedure Laterality Date  . Colposcopy    . Wisdom tooth extraction  age 70  . Laparoscopic tubal ligation Bilateral 07/22/2013    Procedure: LAPAROSCOPIC TUBAL LIGATION;  Surgeon: Antionette Char, MD;  Location: [facility];  Service: Gynecology;  Laterality: Bilateral;   Family History:  Family History  Problem Relation  Age of Onset  . Cancer Paternal Grandmother   . Depression Maternal Uncle   . Bipolar disorder Maternal Grandmother   . Depression Paternal Aunt   . Drug abuse Brother    Social History:  History  Alcohol Use No    Comment: occasional - one glass of wine per month     History  Drug Use No    Comment: Reports she quit THC early July 2014, used cocaine once in July of 2013    History   Social History  . Marital Status: Single    Spouse Name: N/A    Number of Children: N/A  . Years of Education: N/A   Social History Main Topics  . Smoking status: Current Every Day Smoker -- 0.50 packs/day for 11 years    Types: Cigarettes  . Smokeless tobacco: Never Used  . Alcohol Use: No     Comment: occasional - one glass of wine per month  . Drug Use: No     Comment: Reports she quit THC early July 2014, used cocaine once in July of 2013  . Sexual Activity: Yes    Birth Control/ Protection: Condom, Surgical     Comment: Tubal Ligation    Other Topics Concern  . None   Social History Narrative   08/25/2012 AHW  Negin was born and grew up in [place], [place]. She has a younger brother. Her parents divorced when she was 55 years old. She reports that her childhood was "awful." She reports that she witnessed her  parents have frequent verbal confrontations. She graduated from high school. 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 She has never been married. She has a 28-year-old daughter. She currently lives with her mother and her daughter. Her hobbies include reading and painting. She affiliates as a CuratorChristian. She denies any legal difficulties. She reports that her social support system consists of her mother and father. 08/25/2012 AHW    Additional History:    Sleep: Good  Appetite:  Good    Musculoskeletal: Strength & Muscle Tone: within normal limits Gait & Station: normal Patient leans: N/A    Psychiatric Specialty Exam: Physical Exam  Vitals reviewed.   ROS  Blood pressure 103/64, pulse 75, temperature 98 F (36.7 C), temperature source Oral, resp. rate 18, height 5' (1.524 m), weight 41.731 kg (92 lb).Body mass index is 17.97 kg/(m^2).   General Appearance: Fairly Groomed  Patent attorneyye Contact:: Good  Speech: Clear and Coherent  Volume: Normal  Mood: anxious ,improving  Affect: Labile  Thought Process: linear  Orientation: Full (Time, Place, and Person)  Thought Content: Rumination  Suicidal Thoughts: No  Homicidal Thoughts: No  Memory: Immediate; Fair Recent; Fair Remote; Fair  Judgement: Fair  Insight: Fair  Psychomotor Activity: Normal  Concentration: Good  Recall: Good  Fund of Knowledge:Good  Language: Good  Akathisia: Negative  Handed: Right  AIMS (if indicated):    Assets: Communication Skills Physical Health Resilience Social Support  ADL's: Intact  Cognition: WNL  Sleep: Number of Hours: 6.25   Current Medications: Current Facility-Administered Medications  Medication Dose Route Frequency Provider Last Rate Last Dose  . acetaminophen (TYLENOL) tablet 650 mg  650 mg Oral Q6H PRN Jomarie LongsSaramma Treyshon Buchanon, MD      . alum & mag hydroxide-simeth (MAALOX/MYLANTA) 200-200-20 MG/5ML suspension 30 mL  30 mL Oral Q4H PRN Gracyn Allor, MD      . OLANZapine (ZYPREXA) tablet 2.5 mg  2.5 mg Oral BH-qamhs Maryn Freelove, MD   2.5 mg at 03/07/14 60450842   And  . benztropine (COGENTIN) tablet 0.5 mg  0.5 mg Oral BH-qamhs Victorian Gunn, MD   0.5 mg at 03/07/14 [id number]  . haloperidol (HALDOL) tablet 5 mg  5 mg Oral Q6H PRN Jomarie LongsSaramma Sharika Mosquera, MD   5 mg at 03/03/14 1400   And  . benztropine (COGENTIN) tablet 1 mg  1 mg Oral Q6H PRN Shonna Deiter, MD      . divalproex (DEPAKOTE) DR tablet 250 mg  250 mg Oral BH-qamhs Yoana Staib, MD      . gabapentin (NEURONTIN) capsule 100 mg  100 mg Oral BH-[id number] Jomarie LongsSaramma Emmelia Holdsworth, MD   100 mg at  03/07/14 0842  . magnesium hydroxide (MILK OF MAGNESIA) suspension 30 mL  30 mL Oral Daily PRN Jomarie LongsSaramma Amedeo Detweiler, MD      . traZODone (DESYREL) tablet 50 mg  50 mg Oral QHS Jomarie LongsSaramma Obera Stauch, MD   50 mg at 03/06/14 2200    Lab Results:  Results for orders placed or performed during the hospital encounter of 03/02/14 (from the past 48 hour(s))  Valproic acid level     Status: Abnormal   Collection Time: 03/07/14  6:35 AM  Result Value Ref Range   Valproic Acid Lvl 132.9 (H) 50.0 - 100.0 ug/mL    Comment: Performed [facility]   Physical Findings: AIMS: Facial and Oral Movements Muscles of Facial Expression: None, normal Lips and Perioral Area: None, normal Jaw: None, normal Tongue:  None, normal,Extremity Movements Upper (arms, wrists, hands, fingers): None, normal Lower (legs, knees, ankles, toes): None, normal, Trunk Movements Neck, shoulders, hips: None, normal, Overall Severity Severity of abnormal movements (highest score from questions above): None, normal Incapacitation due to abnormal movements: None, normal Patient's awareness of abnormal movements (rate only patient's report): No Awareness, Dental Status Current problems with teeth and/or dentures?: No Does patient usually wear dentures?: No  CIWA:    COWS:     Assessment; Patient is a 40 y ol CF with past hx of Bipolar disorder ,presented with paranoia . Patient has been showing response to current medication regimen.   Treatment Plan Summary: Daily contact with patient to assess and evaluate symptoms and progress in treatment and Medication management Review of chart, vital signs, medications, and notes.  1-Individual and group therapy   2-Medication management for affective sx.   Will continue Zyprexa 2.5 mg po bid along with Cogentin 0.5 mg po bid .Denies side effects.   Will reduce Depakote DR to 250 mg po bid for mood lability. Depakote level elevated at 132.9 (03/07/14)  . Will hold PM dose . Will repeat  labs tomorrow AM.  Patient is also positive for Cannabis . Patient will benefit from a substance abuse program once discharged.   3-Coping skills for depression, anxiety  4-Continue crisis stabilization and management  5-Address health issues--monitoring vital signs, stable  6-Treatment plan in progress to prevent relapse of depression and anxiety  Medical Decision Making:  Established Problem, Stable/Improving (1), Review of Psycho-Social Stressors (1), Decision to obtain old records (1), Review of Last Therapy Session (1), Review or order medicine tests (1), Review of Medication Regimen & Side Effects (2) and Review of New Medication or Change in Dosage (2) Problem Points:  Established problem, stable/improving (1), New problem, with additional work-up planned (4) and Review of psycho-social stressors (1) Data Points:  Review and summation of old records (2) Review of medication regiment & side effects (2)  Renatha Rosen MD 03/07/2014, 1:16 PM

## 2014-03-08 DIAGNOSIS — F1210 Cannabis abuse, uncomplicated: Secondary | ICD-10-CM | POA: Insufficient documentation

## 2014-03-08 LAB — VALPROIC ACID LEVEL: Valproic Acid Lvl: 48.6 ug/mL — ABNORMAL LOW (ref 50.0–100.0)

## 2014-03-08 MED ORDER — DIVALPROEX SODIUM 250 MG PO DR TAB
250.0000 mg | DELAYED_RELEASE_TABLET | ORAL | Status: DC
  Administered 2014-03-08: 250 mg via ORAL
  Filled 2014-03-08 (×3): qty 1
  Filled 2014-03-08 (×2): qty 14

## 2014-03-08 MED ORDER — BENZTROPINE MESYLATE 0.5 MG PO TABS: 0.5000 mg | ORAL_TABLET | ORAL | Status: DC

## 2014-03-08 MED ORDER — OLANZAPINE 2.5 MG PO TABS: 2.5000 mg | ORAL_TABLET | ORAL | Status: DC

## 2014-03-08 MED ORDER — GABAPENTIN 100 MG PO CAPS: 100.0000 mg | ORAL_CAPSULE | ORAL | Status: DC

## 2014-03-08 MED ORDER — DIVALPROEX SODIUM 250 MG PO DR TAB: 250.0000 mg | DELAYED_RELEASE_TABLET | ORAL | Status: DC

## 2014-03-08 MED ORDER — TRAZODONE HCL 50 MG PO TABS: 50.0000 mg | ORAL_TABLET | Freq: Every day | ORAL | Status: DC

## 2014-03-08 NOTE — Tx Team (Signed)
  Interdisciplinary Treatment Plan Update   Date Reviewed:  03/08/2014  Time Reviewed:  10:41 AM  Progress in Treatment:   Attending groups: Yes Participating in groups: Yes Taking medication as prescribed: Yes  Tolerating medication: Yes Family/Significant other contact made: Yes  Patient understands diagnosis: Yes  Discussing patient identified problems/goals with staff: Yes  See initial care plan Medical problems stabilized or resolved: Yes Denies suicidal/homicidal ideation: Yes  In tx team Patient has not harmed self or others: Yes  For review of initial/current patient goals, please see plan of care.  Estimated Length of Stay:  D/C today  Reason for Continuation of Hospitalization:   New Problems/Goals identified:  N/A  Discharge Plan or Barriers:   return home, follow up outpt  Additional Comments:  Attendees:  Signature: Ivin BootySarama Eappen, MD 03/08/2014 10:41 AM   Signature: Richelle Itood Halena Mohar, LCSW 03/08/2014 10:41 AM  Signature: Fransisca KaufmannLaura Davis, NP 03/08/2014 10:41 AM  Signature: Orlie PollenMarion Friedman, RN 03/08/2014 10:41 AM  Signature:  03/08/2014 10:41 AM  Signature:  03/08/2014 10:41 AM  Signature:   03/08/2014 10:41 AM  Signature:    Signature:    Signature:    Signature:    Signature:    Signature:      Scribe for Treatment Team:   Richelle Itood Myka Lukins, LCSW  03/08/2014 10:41 AM

## 2014-03-08 NOTE — [facility] Group Notes (Signed)
[facility] LCSW Group Therapy  03/08/2014 1:19 PM  Type of Therapy:  Group Therapy  Participation Level:  Active  Participation Quality:  Attentive  Affect:  Appropriate  Cognitive:  Alert  Insight:  Improving  Engagement in Therapy:  Engaged  Modes of Intervention:  Discussion, Education and Support  Summary of Progress/Problems:[facility]) speaker came to talk about his personal journey with substance abuse and mental illness. Group members were challenged to process ways by which to relate to the speaker. MHA speaker provided handouts and educational information pertaining to groups and services offered by the [facility]. Tara Thompson attended group and stayed the entire time. She was able to relate to the speaker's story about substance abuse.     Hyatt,Candace 03/08/2014, 1:19 PM

## 2014-03-08 NOTE — Progress Notes (Signed)
Discharge note: pt received both written and verbal discharge instructions. Pt verbalized understanding of discharge instructions. Pt agreed to f/u appt and med regimen. Pt denies suicidal thoughts at time of discharge. Pt received belongings from room and locker. Pt was given sample meds and prescriptions. Pt safely left [facility] with her mother.
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
## 2014-03-08 NOTE — Progress Notes (Signed)
  [facility] Discharge Plan :  Will you be returning to the same living situation after discharge:  Yes,  home At discharge, do you have transportation home?: Yes,  mother Do you have the ability to pay for your medications: Yes,  MCD  Release of information consent forms completed and in the chart;  Patient's signature needed at discharge.  Patient to Follow up at: Follow-up Information    Follow up with [facility] On 03/09/2014.   Why:  Thursday at 10:30 with Allayne GitelmanJamie Duvall   Contact information:   [address]  [place]  [phone]      Patient denies SI/HI: Yes,  yes    Safety Planning and Suicide Prevention discussed: Yes,  yes  Has patient been referred to the Quitline?: Patient refused referral  Ida Rogueorth, Denia Mcvicar B 03/08/2014, 10:44 AM

## 2014-03-08 NOTE — Plan of Care (Signed)
Problem: Alteration in mood Goal: STG-Patient is able to attend at least 2 groups per day Outcome: Completed/Met Date Met:  03/08/14 Pt reports she attended all unit groups.     

## 2014-03-08 NOTE — [facility] Suicide Risk Assessment (Signed)
[facility] Discharge Suicide Risk Assessment   Demographic Factors:  Caucasian  Total Time spent with patient: 30 minutes  Musculoskeletal: Strength & Muscle Tone: within normal limits Gait & Station: normal Patient leans: N/A  Psychiatric Specialty Exam: Physical Exam  Review of Systems  Constitutional: Negative.   HENT: Negative.   Eyes: Negative.   Respiratory: Negative.   Cardiovascular: Negative.   Gastrointestinal: Negative.   Genitourinary: Negative.   Musculoskeletal: Negative.   Skin: Negative.   Neurological: Negative.   Psychiatric/Behavioral: Positive for substance abuse.    Blood pressure 103/64, pulse 75, temperature 98 F (36.7 C), temperature source Oral, resp. rate 18, height 5' (1.524 m), weight 41.731 kg (92 lb).Body mass index is 17.97 kg/(m^2).  General Appearance: Casual  Eye Contact::  Good  Speech:  Clear and Coherent409  Volume:  Normal  Mood:  Euthymic  Affect:  Congruent  Thought Process:  Coherent  Orientation:  Full (Time, Place, and Person)  Thought Content:  WDL  Suicidal Thoughts:  No  Homicidal Thoughts:  No  Memory:  Immediate;   Fair Recent;   Fair Remote;   Fair  Judgement:  Fair  Insight:  Fair  Psychomotor Activity:  Normal  Concentration:  Fair  Recall:  [employer] of Knowledge:Fair  Language: Fair  Akathisia:  No  Handed:  Right  AIMS (if indicated):     Assets:  Communication Skills Desire for Improvement  Sleep:  Number of Hours: 6.75  Cognition: WNL  ADL's:  Intact   Have you used any form of tobacco in the last 30 days? (Cigarettes, Smokeless Tobacco, Cigars, and/or Pipes): Yes  Has this patient used any form of tobacco in the last 30 days? (Cigarettes, Smokeless Tobacco, Cigars, and/or Pipes) Yes, Prescription not provided because: patient is not ready , offered resources available   Mental Status Per Nursing Assessment::   On Admission:  NA  Current Mental Status by Physician: patient denies SI/HI/AH/VH  Loss  Factors: NA  Historical Factors: Impulsivity  Risk Reduction Factors:   Living with another person, especially a relative and Positive social support  Continued Clinical Symptoms:  Alcohol/Substance Abuse/Dependencies Previous Psychiatric Diagnoses and Treatments  Cognitive Features That Contribute To Risk:  Polarized thinking    Suicide Risk:  Minimal: No identifiable suicidal ideation.    Principal Problem: Bipolar 1 disorder, depressed, severe Discharge Diagnoses:  DSM 5 : Primary Psychiatric Diagnosis: Bipolar disorder ,type I depressed ,severe with psychosis (RESOLVED ACUTE PHASE)   Secondary Psychiatric Diagnosis: Cannabis use disorder ,mild   Non Psychiatric Diagnosis:  SEE PMH    Patient Active Problem List   Diagnosis Date Noted  . Bipolar 1 disorder, depressed, severe [F31.4] 03/03/2014  . Encounter for female sterilization procedure [Z30.2] 07/22/2013  . Depression [F32.9] 08/25/2012  . Chronic pelvic pain in female [N94.9, G89.29] 04/07/2012    Follow-up Information    Follow up with [facility] On 03/09/2014.   Why:  Thursday at 10:30 with Allayne Gitelman information:   [address]  [place]  [phone]      Plan Of Care/Follow-up recommendations:  Activity:  NO RESTRICTIONS Diet:  REGULAR Tests:  Depakote level needs to be repeated in a week. Other:  follow up with after care  Is patient on multiple antipsychotic therapies at discharge:  No   Has Patient had three or more failed trials of antipsychotic monotherapy by history:  No  Recommended Plan for Multiple Antipsychotic Therapies: NA  Mukhtar Shams MD 03/08/2014, 9:51 AM

## 2014-03-13 NOTE — Progress Notes (Signed)
Patient Discharge Instructions:  After Visit Summary (AVS):   Faxed to:  03/13/14 Discharge Summary Note:   Faxed to:  03/13/14 Psychiatric Admission Assessment Note:   Faxed to:  03/13/14 Suicide Risk Assessment - Discharge Assessment:   Faxed to:  03/13/14 Faxed/Sent to the Next Level Care provider:  03/13/14 Faxed to [facility] @ [phone]  Jerelene ReddenSheena E Darlington, 03/13/2014, 1:10 PM

## 2014-04-19 ENCOUNTER — Ambulatory Visit: Payer: Medicaid Other | Admitting: Obstetrics & Gynecology

## 2014-05-16 ENCOUNTER — Emergency Department (HOSPITAL_COMMUNITY)
Admission: EM | Admit: 2014-05-16 | Discharge: 2014-05-16 | Discharge status: Against Medical Advice | Payer: Medicaid Other | Attending: Emergency Medicine | Admitting: Emergency Medicine

## 2014-05-16 ENCOUNTER — Encounter (HOSPITAL_COMMUNITY): Payer: Self-pay

## 2014-05-16 DIAGNOSIS — Z8742 Personal history of other diseases of the female genital tract: Secondary | ICD-10-CM | POA: Insufficient documentation

## 2014-05-16 DIAGNOSIS — F304 Manic episode in full remission: Secondary | ICD-10-CM | POA: Diagnosis not present

## 2014-05-16 DIAGNOSIS — F419 Anxiety disorder, unspecified: Secondary | ICD-10-CM | POA: Insufficient documentation

## 2014-05-16 DIAGNOSIS — Z008 Encounter for other general examination: Secondary | ICD-10-CM | POA: Diagnosis present

## 2014-05-16 DIAGNOSIS — Z79899 Other long term (current) drug therapy: Secondary | ICD-10-CM | POA: Diagnosis not present

## 2014-05-16 DIAGNOSIS — Z720 Tobacco use: Secondary | ICD-10-CM | POA: Insufficient documentation

## 2014-05-16 DIAGNOSIS — F3174 Bipolar disorder, in full remission, most recent episode manic: Secondary | ICD-10-CM
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
## 2014-05-16 NOTE — ED Notes (Signed)
Pt complains of having an anxiety episode and throwing a lawn chair across the yard, she hasn't been on her medications and voluntarily wanted to come seek help, she denies SI or HI
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
## 2014-05-16 NOTE — ED Notes (Signed)
Bed: WA02 Expected date:  Expected time:  Means of arrival:  Comments: EMS 

## 2014-05-24 ENCOUNTER — Emergency Department (HOSPITAL_COMMUNITY)
Admission: EM | Admit: 2014-05-24 | Discharge: 2014-05-25 | Disposition: A | Discharge status: Home / Self Care | Payer: Medicaid Other | Attending: Emergency Medicine | Admitting: Emergency Medicine

## 2014-05-24 ENCOUNTER — Encounter (HOSPITAL_COMMUNITY): Payer: Self-pay

## 2014-05-24 DIAGNOSIS — Z720 Tobacco use: Secondary | ICD-10-CM | POA: Insufficient documentation

## 2014-05-24 DIAGNOSIS — R440 Auditory hallucinations: Secondary | ICD-10-CM | POA: Insufficient documentation

## 2014-05-24 DIAGNOSIS — Z79899 Other long term (current) drug therapy: Secondary | ICD-10-CM | POA: Diagnosis not present

## 2014-05-24 DIAGNOSIS — Z3202 Encounter for pregnancy test, result negative: Secondary | ICD-10-CM | POA: Insufficient documentation

## 2014-05-24 DIAGNOSIS — R443 Hallucinations, unspecified: Secondary | ICD-10-CM

## 2014-05-24 DIAGNOSIS — R000 Tachycardia, unspecified: Secondary | ICD-10-CM | POA: Diagnosis not present

## 2014-05-24 DIAGNOSIS — Z046 Encounter for general psychiatric examination, requested by authority: Secondary | ICD-10-CM | POA: Diagnosis present

## 2014-05-24 DIAGNOSIS — Z8742 Personal history of other diseases of the female genital tract: Secondary | ICD-10-CM | POA: Insufficient documentation

## 2014-05-24 LAB — CBC
HCT: 41.9 % (ref 36.0–46.0)
HEMOGLOBIN: 14 g/dL (ref 12.0–15.0)
MCH: 30.2 pg (ref 26.0–34.0)
MCHC: 33.4 g/dL (ref 30.0–36.0)
MCV: 90.3 fL (ref 78.0–100.0)
Platelets: 478 10*3/uL — ABNORMAL HIGH (ref 150–400)
RBC: 4.64 MIL/uL (ref 3.87–5.11)
RDW: 13 % (ref 11.5–15.5)
WBC: 8.1 10*3/uL (ref 4.0–10.5)

## 2014-05-24 LAB — COMPREHENSIVE METABOLIC PANEL
ALT: 12 U/L (ref 0–35)
AST: 17 U/L (ref 0–37)
Albumin: 4.8 g/dL (ref 3.5–5.2)
Alkaline Phosphatase: 84 U/L (ref 39–117)
Anion gap: 9 (ref 5–15)
BILIRUBIN TOTAL: 0.7 mg/dL (ref 0.3–1.2)
BUN: 7 mg/dL (ref 6–23)
CO2: 28 mmol/L (ref 19–32)
Calcium: 9.1 mg/dL (ref 8.4–10.5)
Chloride: 102 mmol/L (ref 96–112)
Creatinine, Ser: 0.59 mg/dL (ref 0.50–1.10)
GFR calc Af Amer: >90 mL/min (ref >90)
Glucose, Bld: 116 mg/dL — ABNORMAL HIGH (ref 70–99)
Potassium: 3.4 mmol/L — ABNORMAL LOW (ref 3.5–5.1)
SODIUM: 139 mmol/L (ref 135–145)
Total Protein: 7.6 g/dL (ref 6.0–8.3)

## 2014-05-24 LAB — RAPID URINE DRUG SCREEN, HOSP PERFORMED
AMPHETAMINES: NOT DETECTED
BARBITURATES: NOT DETECTED
BENZODIAZEPINES: NOT DETECTED
Cocaine: NOT DETECTED
OPIATES: NOT DETECTED
TETRAHYDROCANNABINOL: POSITIVE — AB

## 2014-05-24 LAB — ETHANOL

## 2014-05-24 LAB — ACETAMINOPHEN LEVEL: Acetaminophen (Tylenol), Serum: <10 ug/mL — ABNORMAL LOW (ref 10–30)

## 2014-05-24 LAB — POC URINE PREG, ED: PREG TEST UR: NEGATIVE

## 2014-05-24 LAB — SALICYLATE LEVEL: Salicylate Lvl: <4 mg/dL (ref 2.8–20.0)

## 2014-05-24 MED ORDER — ZOLPIDEM TARTRATE 5 MG PO TABS
5.0000 mg | ORAL_TABLET | Freq: Every evening | ORAL | Status: DC | PRN
  Filled 2014-05-24: qty 1

## 2014-05-24 MED ORDER — IBUPROFEN 200 MG PO TABS: 600.0000 mg | ORAL_TABLET | Freq: Three times a day (TID) | ORAL | Status: DC | PRN

## 2014-05-24 MED ORDER — LORAZEPAM 1 MG PO TABS
1.0000 mg | ORAL_TABLET | Freq: Three times a day (TID) | ORAL | Status: DC | PRN
  Administered 2014-05-24 – 2014-05-25 (×2): 1 mg via ORAL
  Filled 2014-05-24 (×2): qty 1

## 2014-05-24 MED ORDER — ONDANSETRON HCL 4 MG PO TABS: 4.0000 mg | ORAL_TABLET | Freq: Three times a day (TID) | ORAL | Status: DC | PRN

## 2014-05-24 MED ORDER — ALUM & MAG HYDROXIDE-SIMETH 200-200-20 MG/5ML PO SUSP: 30.0000 mL | ORAL | Status: DC | PRN
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
## 2014-05-25 ENCOUNTER — Encounter (HOSPITAL_COMMUNITY): Payer: Self-pay | Admitting: *Deleted

## 2014-05-25 ENCOUNTER — Inpatient Hospital Stay (HOSPITAL_COMMUNITY)
Admission: AD | Admit: 2014-05-25 | Discharge: 2014-05-30 | DRG: 885 | Disposition: A | Discharge status: Home / Self Care | Payer: Medicaid Other | Source: Intra-hospital | Attending: Psychiatry | Admitting: Psychiatry

## 2014-05-25 DIAGNOSIS — Z9114 Patient's other noncompliance with medication regimen: Secondary | ICD-10-CM | POA: Diagnosis present

## 2014-05-25 DIAGNOSIS — R440 Auditory hallucinations: Secondary | ICD-10-CM | POA: Diagnosis not present

## 2014-05-25 DIAGNOSIS — F410 Panic disorder [episodic paroxysmal anxiety] without agoraphobia: Secondary | ICD-10-CM | POA: Diagnosis present

## 2014-05-25 DIAGNOSIS — F17210 Nicotine dependence, cigarettes, uncomplicated: Secondary | ICD-10-CM | POA: Diagnosis present

## 2014-05-25 DIAGNOSIS — E876 Hypokalemia: Secondary | ICD-10-CM | POA: Diagnosis present

## 2014-05-25 DIAGNOSIS — Z62811 Personal history of psychological abuse in childhood: Secondary | ICD-10-CM | POA: Diagnosis present

## 2014-05-25 DIAGNOSIS — F1210 Cannabis abuse, uncomplicated: Secondary | ICD-10-CM | POA: Diagnosis present

## 2014-05-25 DIAGNOSIS — F319 Bipolar disorder, unspecified: Secondary | ICD-10-CM | POA: Diagnosis present

## 2014-05-25 DIAGNOSIS — Z62810 Personal history of physical and sexual abuse in childhood: Secondary | ICD-10-CM | POA: Diagnosis present

## 2014-05-25 DIAGNOSIS — F3164 Bipolar disorder, current episode mixed, severe, with psychotic features: Secondary | ICD-10-CM | POA: Diagnosis present

## 2014-05-25 DIAGNOSIS — G4700 Insomnia, unspecified: Secondary | ICD-10-CM | POA: Diagnosis present

## 2014-05-25 MED ORDER — IBUPROFEN 600 MG PO TABS: 600.0000 mg | ORAL_TABLET | Freq: Three times a day (TID) | ORAL | Status: DC | PRN

## 2014-05-25 MED ORDER — OLANZAPINE 7.5 MG PO TABS
7.5000 mg | ORAL_TABLET | Freq: Two times a day (BID) | ORAL | Status: DC
  Administered 2014-05-26: 7.5 mg via ORAL
  Filled 2014-05-25: qty 3
  Filled 2014-05-25 (×4): qty 1

## 2014-05-25 MED ORDER — OLANZAPINE 5 MG PO TBDP
ORAL_TABLET | ORAL | Status: AC
  Filled 2014-05-25: qty 1

## 2014-05-25 MED ORDER — OLANZAPINE 5 MG PO TBDP
5.0000 mg | ORAL_TABLET | Freq: Three times a day (TID) | ORAL | Status: DC | PRN
  Administered 2014-05-25 – 2014-05-26 (×2): 5 mg via ORAL
  Filled 2014-05-25: qty 1

## 2014-05-25 MED ORDER — OLANZAPINE 5 MG PO TBDP: 5.0000 mg | ORAL_TABLET | Freq: Three times a day (TID) | ORAL | Status: DC | PRN

## 2014-05-25 MED ORDER — TRAZODONE HCL 50 MG PO TABS
50.0000 mg | ORAL_TABLET | Freq: Every evening | ORAL | Status: DC | PRN
  Administered 2014-05-27 – 2014-05-28 (×2): 50 mg via ORAL
  Filled 2014-05-25: qty 1

## 2014-05-25 MED ORDER — OLANZAPINE 5 MG PO TABS: 7.5000 mg | ORAL_TABLET | Freq: Two times a day (BID) | ORAL | Status: DC

## 2014-05-25 MED ORDER — ALUM & MAG HYDROXIDE-SIMETH 200-200-20 MG/5ML PO SUSP
30.0000 mL | ORAL | Status: DC | PRN
Start: 2014-05-25 — End: 2014-05-30

## 2014-05-25 MED ORDER — ONDANSETRON HCL 4 MG PO TABS: 4.0000 mg | ORAL_TABLET | Freq: Three times a day (TID) | ORAL | Status: DC | PRN

## 2014-05-25 MED ORDER — TRAZODONE HCL 50 MG PO TABS: 50.0000 mg | ORAL_TABLET | Freq: Every evening | ORAL | Status: DC | PRN
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
## 2014-05-25 NOTE — BH Assessment (Signed)
[facility] by Dr. Jannifer FranklinAkintayo and Julieanne CottonJosephine, NP. The attending provider is Dr. Abelina BachelorEppen. The room assignment is 507-2. Nursing report # [phone]. [employer] to provide transport to the facility.
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
## 2014-05-25 NOTE — Progress Notes (Signed)
Patient ID: Tara Thompson, female   DOB: 1985/09/27, 29 y.o.   MRN: [id number]  Pt has been asleep throughout the afternoon. Pt breathing regular, even and unlabored. Pt in no current distress. 1800 medication held due to sedation. Will continue to monitor.

## 2014-05-25 NOTE — Progress Notes (Signed)
Patient ID: Tara Thompson, female   DOB: 03/30/1985, 29 y.o.   MRN: [id number] Observed pt in bed sleeping respiration even and unlabored. No sign of distress noted. Will continue to monitor

## 2014-05-25 NOTE — Progress Notes (Signed)
Patient did not attend karaoke group tonight. 
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
## 2014-05-25 NOTE — Progress Notes (Signed)
Patient ID: Tara Thompson, female   DOB: 1985-09-05, 29 y.o.   MRN: [id number] Called out pt name, pt responded to her name. Asked if she needed anything, pt responded no and went back to sleep. Will continue to monitor.

## 2014-05-25 NOTE — ED Notes (Signed)
[place] [facility].  [facility].  All belongings given to the [employer] officer.

## 2014-05-25 NOTE — ED Notes (Addendum)
Patient IVC'd by her mother. Patient denies SI, HI and AVH at this time. Patient denies allegations on IVC petition.  Patient reports that she has not seen or talked to her mother since last week on 05/16/14. Patient reports last week she was reading her bible and her family said " stick your Jesus up your ass". Patient states she got angry and threw lawn chair across the yard. Patient states she came to ER on the 4/5 on her own because the conversation made her anxious and agitated. Patient states "My mother keeps Involuntarily committing me  so she can get custody of my 28 year old daughter.  Patient oriented to unit and plan of care discuss. Patient voices no complaints are concerns at this time. Encouragement and support provided and safety maintain. Q 15 min safety checks in place.
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
## 2014-05-26 ENCOUNTER — Encounter (HOSPITAL_COMMUNITY): Payer: Self-pay | Admitting: Psychiatry

## 2014-05-26 DIAGNOSIS — F3164 Bipolar disorder, current episode mixed, severe, with psychotic features: Secondary | ICD-10-CM | POA: Diagnosis present

## 2014-05-26 MED ORDER — NICOTINE 21 MG/24HR TD PT24
21.0000 mg | MEDICATED_PATCH | Freq: Every day | TRANSDERMAL | Status: DC
  Administered 2014-05-26 – 2014-05-30 (×5): 21 mg via TRANSDERMAL
  Filled 2014-05-26 (×7): qty 1

## 2014-05-26 MED ORDER — HALOPERIDOL 5 MG PO TABS: 5.0000 mg | ORAL_TABLET | Freq: Four times a day (QID) | ORAL | Status: DC | PRN

## 2014-05-26 MED ORDER — DIPHENHYDRAMINE HCL 25 MG PO CAPS
50.0000 mg | ORAL_CAPSULE | Freq: Four times a day (QID) | ORAL | Status: DC | PRN
  Filled 2014-05-26: qty 2

## 2014-05-26 MED ORDER — QUETIAPINE FUMARATE 25 MG PO TABS
25.0000 mg | ORAL_TABLET | Freq: Three times a day (TID) | ORAL | Status: DC | PRN
  Administered 2014-05-26 – 2014-05-28 (×3): 25 mg via ORAL
  Filled 2014-05-26 (×2): qty 1
  Filled 2014-05-26: qty 30
  Filled 2014-05-26: qty 1

## 2014-05-26 MED ORDER — QUETIAPINE FUMARATE 25 MG PO TABS
25.0000 mg | ORAL_TABLET | Freq: Once | ORAL | Status: DC
  Filled 2014-05-26: qty 1

## 2014-05-26 MED ORDER — GABAPENTIN 100 MG PO CAPS
200.0000 mg | ORAL_CAPSULE | Freq: Three times a day (TID) | ORAL | Status: DC
  Administered 2014-05-26 – 2014-05-29 (×9): 200 mg via ORAL
  Filled 2014-05-26 (×12): qty 2

## 2014-05-26 MED ORDER — POTASSIUM CHLORIDE CRYS ER 10 MEQ PO TBCR
10.0000 meq | EXTENDED_RELEASE_TABLET | Freq: Two times a day (BID) | ORAL | Status: AC
  Administered 2014-05-26 – 2014-05-27 (×2): 10 meq via ORAL
  Filled 2014-05-26 (×2): qty 1

## 2014-05-26 MED ORDER — DIPHENHYDRAMINE HCL 25 MG PO CAPS: 50.0000 mg | ORAL_CAPSULE | Freq: Four times a day (QID) | ORAL | Status: DC | PRN

## 2014-05-26 MED ORDER — QUETIAPINE FUMARATE 100 MG PO TABS
100.0000 mg | ORAL_TABLET | Freq: Every day | ORAL | Status: DC
  Administered 2014-05-26 – 2014-05-28 (×3): 100 mg via ORAL
  Filled 2014-05-26 (×4): qty 1

## 2014-05-26 MED ORDER — LORAZEPAM 0.5 MG PO TABS: 0.5000 mg | ORAL_TABLET | Freq: Once | ORAL | Status: DC

## 2014-05-26 MED ORDER — DIVALPROEX SODIUM ER 500 MG PO TB24
500.0000 mg | ORAL_TABLET | Freq: Every day | ORAL | Status: DC
  Administered 2014-05-26 – 2014-05-29 (×4): 500 mg via ORAL
  Filled 2014-05-26 (×5): qty 1

## 2014-05-26 NOTE — [facility] Group Notes (Signed)
[facility] LCSW Group Therapy  05/26/2014  1:05 PM  Type of Therapy:  Group therapy  Participation Level:  Active  Participation Quality:  Attentive  Affect:  Flat  Cognitive:  Oriented  Insight:  Limited  Engagement in Therapy:  Limited  Modes of Intervention:  Discussion, Socialization  Summary of Progress/Problems:  Chaplain was here to lead a group on themes of hope and courage. Zannie used the group as setting as another rant against her mother and the system.  She was difficult to redirect.  When another patient cut in and began talking, she got up and left.  CSW followed her outside and excused her attendance from group.  She asked for PRN and said she would go to bed, "as long as me missing group does not make me stay in this hell hole longer."  Assured her it would not.   Daryel Geraldorth, [place] Sara B 05/26/2014 1:55 PM

## 2014-05-26 NOTE — Progress Notes (Signed)
Patient ID: Tara Thompson, female   DOB: 04/16/1985, 29 y.o.   MRN: [id number] D: pt is a new admission sleeping since beginning of shift. Pt up for a brief period. Pt offered something to eat but refused. Patient denies pain. Pt denies any needs or concerns.  No acute distressed noted at this time.   A:  Writer encouraged pt to discuss feelings. Pt encouraged to come to staff with any question or concerns.   R: Patient remains safe.

## 2014-05-26 NOTE — Progress Notes (Signed)
Patient ID: Tara Thompson KDoristine Locksaufman, female   DOB: [birth year], 29 y.o.   MRN: [id number] Pt agitated screaming. Pt reports she was brought here against her will. Pt on the phone pleading with her father about getting her daughter back. Pt cursing on the phone demanding to be discharged. Pt refused to take zyprexa for agitation. Pt stated she is not crazy and does not need medication.

## 2014-05-26 NOTE — Plan of Care (Signed)
Problem: Ineffective individual coping Goal: STG:Pt. will utilize relaxation techniques to reduce stress STG: Patient will utilize relaxation techniques to reduce stress levels  Outcome: Progressing Pt observed playing games in dayroom with peers after noon group. Affect was bright and she was calmer. Pt reported that her PRN Seroquel 25 mg PO at 1334 was helpful.
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
## 2014-05-26 NOTE — Progress Notes (Signed)
D: Pt observed pacing, restless and visibly agitated, verbally abusive (not directed to anyone) in milieu and hall at the beginning of this shift. Pt reported sleeping well last night, with good appetite, good concentration level and normal mood on self inventory sheet. Pt rated her depression 3/10, hopelessness 0/10 and anxiety 0/10 (inventory sheet). Pt's goal for today is "Getting out of here" which she planned to accomplish by "talking to a doctor and telling the truth".   A: Emotional support, encouragement and availability offered to pt throughout this shift. 1:1 contact made with pt to conduct shift assessment. Medications administered as per MD's orders including PRN Seroquel 25 mg PO and Nicotine patch for cravings ("I feel like smoking, danm it, I smoke 3 pkts of cigarettes a day") while yelling and pacing in milieu. Verbal education done on ordered medications. Q 15 minutes checks maintained as ordered without suicidal gestures or incident to note at this time.   R: Pt denied AVH / HI and pain when assessed. Verbally contracts for safety. Presents with flat /sad affect and labile mood. She appears very irritable with multiple verbal outbursts and crying spells at intervals during shift r/t to well being of her 365 yr old daughter; who is currently with pt's bio mom. Pt had a visit with a DSS staff. Pt did tell DSS staff that she wish for her daughter to be with pt's father instead of with her mother during visit. Ms. Carlos AmericanKaufman stated that the environment with her mother was poisonous. Ms. Carlos AmericanKaufman became tearful when DSS staff left, stated to writer "it didn't go well, even if I walk on water, they will not let me keep my daughter". Pt remains safe on / off unit. POC continues.

## 2014-05-26 NOTE — Plan of Care (Signed)
Problem: Ineffective individual coping Goal: STG: Patient will remain free from self harm Outcome: Progressing Pt is safe and free from self harm     
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
## 2014-05-26 NOTE — Progress Notes (Signed)
Patient ID: Tara Thompson, female   DOB: 11-30-1985, 29 y.o.   MRN: [id number] Pt agreed to take Zyprexa for agitated. Pt encourage to come to staff if feeling overwhelmed. Pt accepted and went to breakfast.

## 2014-05-27 ENCOUNTER — Encounter (HOSPITAL_COMMUNITY): Payer: Self-pay | Admitting: Registered Nurse

## 2014-05-27 DIAGNOSIS — F3164 Bipolar disorder, current episode mixed, severe, with psychotic features: Principal | ICD-10-CM

## 2014-05-27 LAB — BASIC METABOLIC PANEL
Anion gap: 8 (ref 5–15)
BUN: 16 mg/dL (ref 6–23)
CALCIUM: 9.2 mg/dL (ref 8.4–10.5)
CO2: 29 mmol/L (ref 19–32)
CREATININE: 0.74 mg/dL (ref 0.50–1.10)
Chloride: 101 mmol/L (ref 96–112)
GFR calc non Af Amer: >90 mL/min (ref >90)
Glucose, Bld: 78 mg/dL (ref 70–99)
Potassium: 3.6 mmol/L (ref 3.5–5.1)
Sodium: 138 mmol/L (ref 135–145)

## 2014-05-27 NOTE — Progress Notes (Signed)
D: Pt denies SI/HI/AVH. Pt is pleasant and cooperative. Pt pt continues to present with hypo-manic behaviors. Pt continues to be fueled by pt gabriel. Pt appears to be in denial about her situation. Pt may appear to have a little insight into the situation.   A: Pt was offered support and encouragement. Pt was given scheduled medications. Pt was encourage to attend groups. Q 15 minute checks were done for safety.   R:Pt attends groups and interacts well with peers and staff. Pt is taking medication. Pt has no complaints at this time.Pt receptive to treatment and safety maintained on unit.

## 2014-05-27 NOTE — Progress Notes (Signed)
Patient ID: Tara Thompson, female   DOB: 05-22-1985, 29 y.o.   MRN: [id number] D: pt sleeping since beginning of shift. Pt up for a brief period to take medication. Pt offered something to eat but declined. Patient denies pain. Pt denies any needs or concerns.  No acute distressed noted at this time.   A:  Medication administered as prescribed. Writer encouraged pt to discuss feelings. Pt encouraged to come to staff with any question or concerns.   R: Patient remains safe.

## 2014-05-27 NOTE — Progress Notes (Signed)
[facility] MD Progress Note  05/27/2014 9:57 AM Tara Thompson  MRN:  [id number]   Subjective:  Patient states "I'm feeling a little bit more stable talking about some of the things I've been through; really working on things to get my daughter back.  I'm just hanging in there; I've been really up set about my daughter and acting out."   Patient upset stating that she was kicked out of group session this morning "because I asked the question and asked her to explain about what stability was and the institution definition of stability and what made them right."    Objective:  Patient seen, chart reviewed, and discussed with staff.  Patient is tolerating medication without adverse affect and participating in group sessions.   Principal Problem: Bipolar disorder, current episode mixed, severe, with psychotic features Diagnosis:   Patient Active Problem List   Diagnosis Date Noted  . Bipolar disorder, current episode mixed, severe, with psychotic features [F31.64] 05/26/2014  . Cannabis use disorder, mild, abuse [F12.10]   . Encounter for female sterilization procedure [Z30.2] 07/22/2013  . Chronic pelvic pain in female [N94.9, G89.29] 04/07/2012   Total Time spent with patient: 45 minutes   Past Medical History:  Past Medical History  Diagnosis Date  . Ovarian cyst   . Abnormal Pap smear     LSIL>normal follow-up pap  . Anxiety   . Mental disorder   . Depression   . Bipolar disorder     Past Surgical History  Procedure Laterality Date  . Colposcopy    . Wisdom tooth extraction  age 53  . Laparoscopic tubal ligation Bilateral 07/22/2013    Procedure: LAPAROSCOPIC TUBAL LIGATION;  Surgeon: Antionette Char, MD;  Location: [facility];  Service: Gynecology;  Laterality: Bilateral;   Family History:  Family History  Problem Relation Age of Onset  . Cancer Paternal Grandmother   . Depression Maternal Uncle   . Bipolar disorder Maternal Grandmother   . Depression Paternal Aunt   . Drug  abuse Brother    Social History:  History  Alcohol Use No    Comment: occasional - one glass of wine per month     History  Drug Use No    Comment: Reports she quit THC early July 2014, used cocaine once in July of 2013    History   Social History  . Marital Status: Single    Spouse Name: N/A  . Number of Children: N/A  . Years of Education: N/A   Social History Main Topics  . Smoking status: Current Every Day Smoker -- 0.50 packs/day for 11 years    Types: Cigarettes  . Smokeless tobacco: Never Used  . Alcohol Use: No     Comment: occasional - one glass of wine per month  . Drug Use: No     Comment: Reports she quit THC early July 2014, used cocaine once in July of 2013  . Sexual Activity: Yes    Birth Control/ Protection: Condom, Surgical     Comment: Tubal Ligation    Other Topics Concern  . None   Social History Narrative   08/25/2012 AHW  Tara Thompson was born and grew up in [place], [place]. She has a younger brother. Her parents divorced when she was 89 years old. She reports that her childhood was "awful." She reports that she witnessed her parents have frequent verbal confrontations. She graduated from high school. 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 She has never been married. She has a 28-year-old daughter. She currently lives with her mother and her daughter. Her hobbies include reading and painting. She affiliates as a CuratorChristian. She denies any legal difficulties. She reports that her social support system consists of her mother and father. 08/25/2012 AHW    Additional History:    Sleep: Good  Appetite:  Good   Assessment:   Musculoskeletal: Strength & Muscle Tone: within normal limits Gait & Station: normal Patient leans: N/A   Psychiatric Specialty Exam: Physical Exam  Constitutional: She is oriented to person, place, and time.  Neck: Normal range of motion.   Respiratory: Effort normal.  Musculoskeletal: Normal range of motion.  Neurological: She is alert and oriented to person, place, and time.  Psychiatric: Her mood appears anxious. Her affect is labile. Her speech is rapid and/or pressured. She is agitated. Thought content is paranoid. She expresses impulsivity.    Review of Systems  Psychiatric/Behavioral: Positive for depression and substance abuse. The patient is nervous/anxious and has insomnia.   All other systems reviewed and are negative.   Blood pressure 116/94, pulse 104, temperature 97.1 F (36.2 C), temperature source Oral, resp. rate 16, height 5\' 4"  (1.626 m), weight 61.236 kg (135 lb), SpO2 100 %.Body mass index is 23.16 kg/(m^2).  General Appearance: Casual  Eye Contact::  Good  Speech:  Clear and Coherent  Volume:  Normal  Mood:  Anxious, Hopeless and Irritable  Affect:  Labile  Thought Process:  Circumstantial, Irrelevant and Loose  Orientation:  Full (Time, Place, and Person)  Thought Content:  Delusions, Paranoid Ideation and Rumination  Suicidal Thoughts:  Denies  Homicidal Thoughts:  Denies  Memory:  Immediate;   Fair Recent;   Fair Remote;   Fair  Judgement:  Fair  Insight:  Fair  Psychomotor Activity:  Restlessness  Concentration:  Fair  Recall:  Good  Fund of Knowledge:Good  Language: Good  Akathisia:  Negative  Handed:  Right  AIMS (if indicated):     Assets:  Communication Skills Desire for Improvement  ADL's:  Intact  Cognition: WNL  Sleep:  Number of Hours: 6.5     Current Medications: Current Facility-Administered Medications  Medication Dose Route Frequency Provider Last Rate Last Dose  . alum & mag hydroxide-simeth (MAALOX/MYLANTA) 200-200-20 MG/5ML suspension 30 mL  30 mL Oral PRN Earney NavyJosephine C Onuoha, NP      . divalproex (DEPAKOTE ER) 24 hr tablet 500 mg  500 mg Oral QHS Jomarie LongsSaramma Eappen, MD   500 mg at 05/26/14 2217  . gabapentin (NEURONTIN) capsule 200 mg  200 mg Oral TID Jomarie LongsSaramma Eappen,  MD   200 mg at 05/27/14 0844  . ibuprofen (ADVIL,MOTRIN) tablet 600 mg  600 mg Oral Q8H PRN Earney NavyJosephine C Onuoha, NP      . nicotine (NICODERM CQ - dosed in mg/24 hours) patch 21 mg  21 mg Transdermal Daily Jomarie LongsSaramma Eappen, MD   21 mg at 05/26/14 0846  . ondansetron (ZOFRAN) tablet 4 mg  4 mg Oral Q8H PRN Earney NavyJosephine C Onuoha, NP      . QUEtiapine (SEROQUEL) tablet 100 mg  100 mg Oral QHS Jomarie LongsSaramma Eappen, MD   100 mg at 05/26/14 2217  . QUEtiapine (SEROQUEL) tablet 25 mg  25 mg Oral TID PRN Jomarie LongsSaramma Eappen, MD   25 mg at 05/26/14 1334  . traZODone (DESYREL) tablet 50 mg  50 mg Oral QHS PRN Earney NavyJosephine C Onuoha, NP  Lab Results: No results found for this or any previous visit (from the past 48 hour(s)).  Physical Findings: AIMS:  , ,  ,  ,    CIWA:  CIWA-Ar Total: 1 COWS:     Treatment Plan Summary: Daily contact with patient to assess and evaluate symptoms and progress in treatment and Medication management  Patient will benefit from inpatient treatment and stabilization.  Estimated length of stay is 5-7 days.  Reviewed past medical records,treatment plan.  Will start a trial of Seroquel 100 mg po qhs - patient prefers to be on it. Will start a trial of Gabapentin 200 mg po tid for anxiety sx. Will restart Depakote ER 500 mg po qhs for mood lability. Trazodone 50 mg po qhs prn for sleep. Patient with hypokalemia , will replace with Kdur 10 x2 dose - repeat BMP tomorrow PM. Will continue to monitor vitals ,medication compliance and treatment side effects while patient is here.  Will monitor for medical issues as well as call consult as needed.  Reviewed labs ,will order as needed.  CSW will start working on disposition.  Patient to participate in therapeutic milieu .   Will continue with current treatment plan with no changes at this time  Medical Decision Making:  Established Problem, Stable/Improving (1), Review of Psycho-Social Stressors (1), Review or order clinical lab  tests (1), Review of Last Therapy Session (1), Independent Review of image, tracing or specimen (2) and Review of Medication Regimen & Side Effects (2)   Rankin, Shuvon, FNP-BC 05/27/2014, 9:57 AM   I agreed with the findings, treatment and disposition plan of this patient. Kathryne Sharper, MD

## 2014-05-27 NOTE — Progress Notes (Signed)
Tara Thompson demonstrates manic behaviors as evidenced by her inability to talk, unchaperoned, without cussing and vulgarity, the  rapid rate at which she speaks, her intrusiveness and her disorganized thought and speech pattern. She has difficulty starting and finishing a task, as evidenced by her starting to color a page ( in the dayroom with this Clinical research associatewriter) and then getting distracted, leaving the coloring and following a patient out of the dayroom down the hall.   A She completes her morning assessment and on it she wrotes she denies SI today and she rates her depression, hopelessness and anxiety " 0/0/0/, respectively.   R She takes direction well and willingly checkes herslef when staff set boundaries with her and / or ask her to tone her behavior.

## 2014-05-27 NOTE — Progress Notes (Signed)
Psychoeducational Group Note  Date:  0416/2016 Time: 1015  Group Topic/Focus:   Identifying Needs:   The focus of this group is to help patients identify their personal needs that have been historically problematic and identify healthy behaviors to address their needs.  Participation Level:  Active  Participation Quality: good  Affect: flat  Cognitive:  intact  Insight:  limited  Engagement in Group: engaged  Additional Comments: PDuke RN [facility]
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
## 2014-05-27 NOTE — Progress Notes (Signed)
Pt stated that she had a good day. Even though she had a hard time staying on one subject.

## 2014-05-28 MED ORDER — HYDROXYZINE HCL 50 MG PO TABS
50.0000 mg | ORAL_TABLET | Freq: Two times a day (BID) | ORAL | Status: DC | PRN
  Administered 2014-05-28 – 2014-05-29 (×2): 50 mg via ORAL
  Filled 2014-05-28: qty 20
  Filled 2014-05-28 (×2): qty 1

## 2014-05-28 NOTE — [facility] Group Notes (Signed)
[facility] Group Notes:  (Clinical Social Work)  05/28/2014   11:15am-12:00pm  Summary of Progress/Problems:  The main focus of today's process group was to listen to a variety of genres of music and to identify that different types of music provoke different responses.  The patient then was able to identify personally what was soothing for them, as well as energizing.  Handouts were used to record feelings evoked, as well as how patient can personally use this knowledge in sleep habits, with depression, and with other symptoms.  The patient expressed understanding of concepts, as well as knowledge of how each type of music affected him/her and how this can be used at home as a wellness/recovery tool.  She was less hyperverbal as time progressed in the group setting.  She was very engaged, difficult to redirect from constant remarks.  Type of Therapy:  Music Therapy   Participation Level:  Active  Participation Quality:  Attentive and Sharing, Inattentive and Disorganized  Affect:  Blunted  Cognitive:  Disorganized, Delusional  Insight:  Improving  Engagement in Therapy:  Improving  Modes of Intervention:   Activity, Exploration  Ambrose MantleMareida Grossman-Orr, LCSW 05/28/2014, 12:30pm

## 2014-05-28 NOTE — Progress Notes (Signed)
`    Tara Thompson remains manic as evidenced by her pressured speech, disorganized tangential thought and speech patterns. SHe is difficult to follow sometimes and gets easily sidetracked while speaking.She does allow staff to set boundaries with her and she handles social prompting and cueing, especially when this nurse prompts her to " watch her  Profanity PLEASE!!!!".     A She is engaged in the therapy and her recovery as evidenced by her completing her daily asessment   first thing this morning and on  She wrote she denied SI and she rated her depression, hopelessness and anxiety " 0/0/0", respectively. Her mania is slightly lessened today, and her ability to take direction from staff -  And her abilities to process_ have increased a little today. This nurse shares this insight with the pt this evning and she got very excited ( and happy) that she is ' getting better'  !!!!   R Safety  Is in place and poc cont.

## 2014-05-28 NOTE — Progress Notes (Signed)
[facility] MD Progress Note  05/28/2014 10:18 AM Tara Thompson  MRN:  [id number]   Subjective:  Patient states "I'm having a better day today than I was."  Patient states that she need something for anxiety states that the Ativan worked and that Vistaril did not work in the past. Discussed restarting Vistaril at 50 mg BId.     Objective:  Patient seen, chart reviewed, and discussed with staff.  Patient is tolerating medication without adverse affect and participating in group sessions.   Principal Problem: Bipolar disorder, current episode mixed, severe, with psychotic features Diagnosis:   Patient Active Problem List   Diagnosis Date Noted  . Bipolar disorder, current episode mixed, severe, with psychotic features [F31.64] 05/26/2014  . Cannabis use disorder, mild, abuse [F12.10]   . Encounter for female sterilization procedure [Z30.2] 07/22/2013  . Chronic pelvic pain in female [N94.9, G89.29] 04/07/2012   Total Time spent with patient: 45 minutes   Past Medical History:  Past Medical History  Diagnosis Date  . Ovarian cyst   . Abnormal Pap smear     LSIL>normal follow-up pap  . Anxiety   . Mental disorder   . Depression   . Bipolar disorder     Past Surgical History  Procedure Laterality Date  . Colposcopy    . Wisdom tooth extraction  age 16  . Laparoscopic tubal ligation Bilateral 07/22/2013    Procedure: LAPAROSCOPIC TUBAL LIGATION;  Surgeon: Lahoma Crocker, MD;  Location: [facility];  Service: Gynecology;  Laterality: Bilateral;   Family History:  Family History  Problem Relation Age of Onset  . Cancer Paternal Grandmother   . Depression Maternal Uncle   . Bipolar disorder Maternal Grandmother   . Depression Paternal Aunt   . Drug abuse Brother    Social History:  History  Alcohol Use No    Comment: occasional - one glass of wine per month     History  Drug Use No    Comment: Reports she quit THC early July 2014, used cocaine once in July of 2013     History   Social History  . Marital Status: Single    Spouse Name: N/A  . Number of Children: N/A  . Years of Education: N/A   Social History Main Topics  . Smoking status: Current Every Day Smoker -- 0.50 packs/day for 11 years    Types: Cigarettes  . Smokeless tobacco: Never Used  . Alcohol Use: No     Comment: occasional - one glass of wine per month  . Drug Use: No     Comment: Reports she quit THC early July 2014, used cocaine once in July of 2013  . Sexual Activity: Yes    Birth Control/ Protection: Condom, Surgical     Comment: Tubal Ligation    Other Topics Concern  . None   Social History Narrative   08/25/2012 AHW  Tara Thompson was born and grew up in [place], [place]. She has a younger brother. Her parents divorced when she was 89 years old. She reports that her childhood was "awful." She reports that she witnessed her parents have frequent verbal confrontations. She graduated from high school. 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 She has never been married. She has a 69-year-old daughter. She currently lives with her mother and her daughter. Her hobbies include reading and painting. She affiliates as a Engineer, manufacturing. She denies any legal difficulties. She reports that  her social support system consists of her mother and father. 08/25/2012 AHW    Additional History:    Sleep: Good  Appetite:  Good   Assessment:   Musculoskeletal: Strength & Muscle Tone: within normal limits Gait & Station: normal Patient leans: N/A   Psychiatric Specialty Exam: Physical Exam  Constitutional: She is oriented to person, place, and time.  Neck: Normal range of motion.  Respiratory: Effort normal.  Musculoskeletal: Normal range of motion.  Neurological: She is alert and oriented to person, place, and time.  Psychiatric: Her mood appears anxious. Her affect is labile. Her speech is rapid and/or pressured.  She is agitated. Thought content is paranoid. She expresses impulsivity.    Review of Systems  Psychiatric/Behavioral: Depression: Deniies. Suicidal ideas: Denies. The patient is nervous/anxious.   All other systems reviewed and are negative.   Blood pressure 100/55, pulse 66, temperature 98 F (36.7 C), temperature source Oral, resp. rate 16, height $RemoveBe'5\' 4"'[id number]$  (1.626 m), weight 61.236 kg (135 lb), SpO2 100 %.Body mass index is 23.16 kg/(m^2).  General Appearance: Casual  Eye Contact::  Good  Speech:  Clear and Coherent  Volume:  Normal  Mood:  Anxious, Hopeless and Irritable  Affect:  Labile  Thought Process:  Circumstantial, Irrelevant and Loose  Orientation:  Full (Time, Place, and Person)  Thought Content:  Delusions, Paranoid Ideation and Rumination  Suicidal Thoughts:  Denies  Homicidal Thoughts:  Denies  Memory:  Immediate;   Fair Recent;   Fair Remote;   Fair  Judgement:  Fair  Insight:  Fair  Psychomotor Activity:  Restlessness  Concentration:  Fair  Recall:  Good  Fund of Knowledge:Good  Language: Good  Akathisia:  Negative  Handed:  Right  AIMS (if indicated):     Assets:  Communication Skills Desire for Improvement  ADL's:  Intact  Cognition: WNL  Sleep:  Number of Hours: 5.75     Current Medications: Current Facility-Administered Medications  Medication Dose Route Frequency Provider Last Rate Last Dose  . alum & mag hydroxide-simeth (MAALOX/MYLANTA) 200-200-20 MG/5ML suspension 30 mL  30 mL Oral PRN Delfin Gant, NP      . divalproex (DEPAKOTE ER) 24 hr tablet 500 mg  500 mg Oral QHS Ursula Alert, MD   500 mg at 05/27/14 2149  . gabapentin (NEURONTIN) capsule 200 mg  200 mg Oral TID Ursula Alert, MD   200 mg at 05/28/14 0816  . ibuprofen (ADVIL,MOTRIN) tablet 600 mg  600 mg Oral Q8H PRN Delfin Gant, NP      . nicotine (NICODERM CQ - dosed in mg/24 hours) patch 21 mg  21 mg Transdermal Daily Ursula Alert, MD   21 mg at 05/28/14 0819  .  ondansetron (ZOFRAN) tablet 4 mg  4 mg Oral Q8H PRN Delfin Gant, NP      . QUEtiapine (SEROQUEL) tablet 100 mg  100 mg Oral QHS Ursula Alert, MD   100 mg at 05/27/14 2149  . QUEtiapine (SEROQUEL) tablet 25 mg  25 mg Oral TID PRN Ursula Alert, MD   25 mg at 05/28/14 0828  . traZODone (DESYREL) tablet 50 mg  50 mg Oral QHS PRN Delfin Gant, NP   50 mg at 05/27/14 2229    Lab Results:  Results for orders placed or performed during the hospital encounter of 05/25/14 (from the past 48 hour(s))  Basic metabolic panel     Status: None   Collection Time: 05/27/14  7:30 PM  Result Value  Ref Range   Sodium 138 135 - 145 mmol/L   Potassium 3.6 3.5 - 5.1 mmol/L   Chloride 101 96 - 112 mmol/L   CO2 29 19 - 32 mmol/L   Glucose, Bld 78 70 - 99 mg/dL   BUN 16 6 - 23 mg/dL   Creatinine, Ser 0.74 0.50 - 1.10 mg/dL   Calcium 9.2 8.4 - 10.5 mg/dL   GFR calc non Af Amer >90 >90 mL/min   GFR calc Af Amer >90 >90 mL/min    Comment: (NOTE) The eGFR has been calculated using the CKD EPI equation. This calculation has not been validated in all clinical situations. eGFR's persistently <90 mL/min signify possible Chronic Kidney Disease.    Anion gap 8 5 - 15    Comment: Performed at [facility]    Physical Findings: AIMS:  , ,  ,  ,    CIWA:  CIWA-Ar Total: 1 COWS:     Treatment Plan Summary: Daily contact with patient to assess and evaluate symptoms and progress in treatment and Medication management  Patient will benefit from inpatient treatment and stabilization.  Estimated length of stay is 5-7 days.  Reviewed past medical records,treatment plan.  Will start a trial of Seroquel 100 mg po qhs - patient prefers to be on it. Will start a trial of Gabapentin 200 mg po tid for anxiety sx. Will restart Depakote ER 500 mg po qhs for mood lability. Trazodone 50 mg po qhs prn for sleep. Patient with hypokalemia , will replace with Kdur 10 x2 dose - repeat BMP  tomorrow PM. Will continue to monitor vitals ,medication compliance and treatment side effects while patient is here.  Will monitor for medical issues as well as call consult as needed.  Reviewed labs ,will order as needed.  CSW will start working on disposition.  Patient to participate in therapeutic milieu .   Will continue with current treatment plan no changes except for Vistaril 50 mg Bid prn for anxiety. .  Medical Decision Making:  Established Problem, Stable/Improving (1), Review of Psycho-Social Stressors (1), Review of Last Therapy Session (1) and Review of Medication Regimen & Side Effects (2)   Rankin, Shuvon, FNP-BC 05/28/2014, 10:18 AM   I agreed with the findings, treatment and disposition plan of this patient. Berniece Andreas, MD

## 2014-05-28 NOTE — Plan of Care (Signed)
Problem: Ineffective individual coping Goal: LTG: Patient will report a decrease in negative feelings Outcome: Progressing Pt denies SI/ HI at this time

## 2014-05-28 NOTE — [facility] Group Notes (Signed)
[facility] Group Notes:  (Nursing/MHT/Case Management/Adjunct)  Date:  05/28/2014  Time:  3:13 PM  Type of Therapy:  Psychoeducational Skills  Participation Level:  Active  Participation Quality:  Appropriate  Affect:  Appropriate  Cognitive:  Appropriate  Insight:  Appropriate  Engagement in Group:  Engaged  Modes of Intervention:  Discussion  Summary of Progress/Problems: Pt did attend healthy support systems group.  Jacquelyne BalintForrest, Louine Tenpenny Shanta 05/28/2014, 3:13 PM

## 2014-05-28 NOTE — [facility] Group Notes (Signed)
[facility] Group Notes:  (Nursing/MHT/Case Management/Adjunct)  Date:  05/28/2014  Time:  3:11 PM  Type of Therapy:  Psychoeducational Skills  Participation Level:  Active  Participation Quality:  Appropriate  Affect:  Appropriate  Cognitive:  Appropriate  Insight:  Appropriate  Engagement in Group:  Engaged  Modes of Intervention:  Discussion  Summary of Progress/Problems: Pt did attend self inventory group, pt reported that she was negative SI/HI, no AH/VH noted. Pt rated her depression as a 0, and her helplessness/hopelessness as a 6.     Pt reported concerns regarding anxiety, Shavon NP made aware.  Jacquelyne BalintForrest, Clyde Zarrella Shanta 05/28/2014, 3:11 PM

## 2014-05-29 MED ORDER — GABAPENTIN 300 MG PO CAPS
300.0000 mg | ORAL_CAPSULE | Freq: Three times a day (TID) | ORAL | Status: DC
  Administered 2014-05-29 – 2014-05-30 (×4): 300 mg via ORAL
  Filled 2014-05-29 (×6): qty 1

## 2014-05-29 MED ORDER — QUETIAPINE FUMARATE 50 MG PO TABS
150.0000 mg | ORAL_TABLET | Freq: Every day | ORAL | Status: DC
  Administered 2014-05-29: 150 mg via ORAL
  Filled 2014-05-29 (×2): qty 3
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
## 2014-05-29 NOTE — Plan of Care (Signed)
Problem: Consults Goal: [facility] General Treatment Patient Education Outcome: Completed/Met Date Met:  05/29/14 Nurse discussed anxiety, depression and coping skills with patient.
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
## 2014-05-30 LAB — VALPROIC ACID LEVEL: VALPROIC ACID LVL: 64.9 ug/mL (ref 50.0–100.0)

## 2014-05-30 MED ORDER — QUETIAPINE FUMARATE 50 MG PO TABS: 150.0000 mg | ORAL_TABLET | Freq: Every day | ORAL | Status: DC

## 2014-05-30 MED ORDER — GABAPENTIN 300 MG PO CAPS: 300.0000 mg | ORAL_CAPSULE | Freq: Three times a day (TID) | ORAL | Status: DC

## 2014-05-30 MED ORDER — HYDROXYZINE HCL 50 MG PO TABS: 50.0000 mg | ORAL_TABLET | Freq: Two times a day (BID) | ORAL | Status: DC | PRN

## 2014-05-30 MED ORDER — DIVALPROEX SODIUM ER 500 MG PO TB24: 500.0000 mg | ORAL_TABLET | Freq: Every day | ORAL | Status: DC

## 2014-05-30 MED ORDER — QUETIAPINE FUMARATE 25 MG PO TABS: 25.0000 mg | ORAL_TABLET | Freq: Three times a day (TID) | ORAL | Status: DC | PRN

## 2014-05-30 NOTE — Progress Notes (Signed)
DL:  Patient's self inventory sheet, patient sleeping good, no sleep medication needed.  Good appetite, normal energy, good concentration.  Denied anxiety, hopeless, and depression.  Denied withdrawals.  Denied SI.  Denied physical pain.  Goal "I would like to get together and plan a discharge plan." Plans to talk with MD and attending group.  No problems anticipated after discharge. A:  Medications administered per MD orders.  Emotional support and encouragement given patient. R:  Denied SI and HI, contracts for safety.  Denied A/V hallucinations.  Safety maintained with 15 minute checks. Patient looking forward to discharge.

## 2014-05-30 NOTE — [facility] Group Notes (Signed)
The focus of this group is to educate the patient on the purpose and policies of crisis stabilization and provide a format to answer questions about their admission.  The group details unit policies and expectations of patients while admitted.  Patient attended 0900 nurse education orientation group this morning.  Patient actively participated, appropriate affect, alert, appropriate insight and engagement.  Today patient will work on 3 goals for discharge.  

## 2014-05-30 NOTE — Discharge Summary (Signed)
Physician Discharge Summary Note  Patient:  Tara Thompson is an 29 y.o., female MRN:  [id number] DOB:  [birth year] Patient phone:  [phone] (home)  Patient address:   [address] [place] [address],  Total Time spent with patient: Greater than 30 minutes  Date of Admission:  05/25/2014  Date of Discharge: 05/30/14  Reason for Admission:  Worsening symptoms of depression  Principal Problem: Bipolar disorder, current episode mixed, severe, with psychotic features Discharge Diagnoses: Patient Active Problem List   Diagnosis Date Noted  . Bipolar disorder, current episode mixed, severe, with psychotic features [F31.64] 05/26/2014  . Cannabis use disorder, mild, abuse [F12.10]   . Encounter for female sterilization procedure [Z30.2] 07/22/2013  . Chronic pelvic pain in female [N94.9, G89.29] 04/07/2012   Musculoskeletal: Strength & Muscle Tone: within normal limits Gait & Station: normal Patient leans: N/A  Psychiatric Specialty Exam: Physical Exam  Psychiatric: Her speech is normal and behavior is normal. Judgment and thought content normal. Her mood appears not anxious. Her affect is not angry, not blunt, not labile and not inappropriate. Cognition and memory are normal. She does not exhibit a depressed mood.    Review of Systems  Constitutional: Negative.   HENT: Negative.   Eyes: Negative.   Respiratory: Negative.   Cardiovascular: Negative.   Gastrointestinal: Negative.   Genitourinary: Negative.   Musculoskeletal: Negative.   Skin: Negative.   Neurological: Negative.   Endo/Heme/Allergies: Negative.   Psychiatric/Behavioral: Positive for depression (Stable) and substance abuse (Hx, Cannabis abuse). Negative for suicidal ideas, hallucinations and memory loss. The patient has insomnia (Stable). The patient is not nervous/anxious.     Blood pressure 103/67, pulse 89, temperature 98 F (36.7 C), temperature source Oral, resp. rate 16, height _0  (1.626 m),  weight 61.236 kg (135 lb), SpO2 100 %.Body mass index is 23.16 kg/(m^2).  Greater than 30 minutes   Past Medical History:  Past Medical History  Diagnosis Date  . Ovarian cyst   . Abnormal Pap smear     LSIL>normal follow-up pap  . Anxiety   . Mental disorder   . Depression   . Bipolar disorder     Past Surgical History  Procedure Laterality Date  . Colposcopy    . Wisdom tooth extraction  age 45  . Laparoscopic tubal ligation Bilateral 07/22/2013    Procedure: LAPAROSCOPIC TUBAL LIGATION;  Surgeon: Lahoma Crocker, MD;  Location: [facility];  Service: Gynecology;  Laterality: Bilateral;   Family History:  Family History  Problem Relation Age of Onset  . Cancer Paternal Grandmother   . Depression Maternal Uncle   . Bipolar disorder Maternal Grandmother   . Depression Paternal Aunt   . Drug abuse Brother    Social History:  History  Alcohol Use No    Comment: occasional - one glass of wine per month     History  Drug Use No    Comment: Reports she quit THC early July 2014, used cocaine once in July of 2013    History   Social History  . Marital Status: Single    Spouse Name: N/A  . Number of Children: N/A  . Years of Education: N/A   Social History Main Topics  . Smoking status: Current Every Day Smoker -- 0.50 packs/day for 11 years    Types: Cigarettes  . Smokeless tobacco: Never Used  . Alcohol Use: No     Comment: occasional - one glass of wine per month  . Drug Use: No  Comment: Reports she quit THC early July 2014, used cocaine once in July of 2013  . Sexual Activity: Yes    Birth Control/ Protection: Condom, Surgical     Comment: Tubal Ligation    Other Topics Concern  . None   Social History Narrative   08/25/2012 [facility]  Heena was born and grew up in [place], [place]. She has a younger brother. Her parents divorced when she was 63 years old. She reports that her childhood was "awful." She reports that she witnessed her parents have  frequent verbal confrontations. She graduated from high school. 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 She has never been married. She has a 54-year-old daughter. She currently lives with her mother and her daughter. Her hobbies include reading and painting. She affiliates as a Engineer, manufacturing. She denies any legal difficulties. She reports that her social support system consists of her mother and father. 08/25/2012 [facility]    Risk to Self: Is patient at risk for suicide?: No  Risk to Others: No  Prior Inpatient Therapy: No  Prior Outpatient Therapy: No  Level of Care:  OP  Hospital Course:  Patient is a 29 year old caucasian female, who is single, unemployed, lives with her mother and mother's boyfriend, presented to [facility] referred by her mobil crisis team . Patient per initial notes in EHR - was brought in by police under IVC taken out by her mother. IVC paperwork states "the respondent has been diagnosed as bipolar and schizophrenia. The respondent is hostile and aggressive and threatened to kill her grandparents. The respondent has been throwing furniture from the deck of the home. The respondent reports hearing voices that are telling her to urinate on the porch of the home and onto [address]. The plaintiff witnessed the respondent urinate both on the porch of the home and onto the highway. The respondent is a danger to herself and others."   Ayriana was admitted to the adult unit under IVC for worsening symptoms of her mental illness. Report indicated she was having homicidal threats towards her grandparents and presenting other erratic acts/behavior. She was in need of mood stabilization treatments. During her admission assessment, she was evaluated and her symptoms were identified. Medication management was discussed and initiated targeting her presenting symptoms. She was oriented to the unit and encouraged to participate in unit  programming.   During her hospital stay, Kolleen was evaluated daily by a clinical provider to assure her response to her treatment regimen. Medication changes & adjustments were made according to need. As the day goes by, improvement was noted as evidenced by her report of decreasing symptoms, improved sleep, presentation of good affect, medication tolerance, behavior, and participation in the unit programming. She was required on daily basis to complete a self inventory asssessment noting mood, mental status, pain, any new symptoms, anxiety and concerns. Her symptoms responded well to her treatment regimen. Also, being in a therapeutic and supportive environment also assisted in her mood stability. Deoni did present appropriate behavior & was motivated for recovery. She worked closely with the treatment team and case manager to develop a discharge plan with appropriate goals to maintain mood stability after discharge. Coping skills, problem solving as well as relaxation therapies were also part of the her programming.  On this day of her discharge, Shaely was in much improved condition than upon admission. Her symptoms were reported as significantly decreased or resolved completely. Upon discharge, she  denies SI/HI and voiced no AVH. She was motivated to continue taking medication with a goal of continued improvement in mental health. Kahli was medicated & discharge on; Depakote ER 500 mg for mood stabilization, Seroquel 50 mg & 25 mg for mood control/agitation, Hydroxyzine 50 mg for anxiety prn & Gabapentin 300 mg for agitation. She is currently being discharged to follow-up care at the [facility] here in [place], [place] for medication managment & routine psychiatric care. She is provided with all the necessary information needed to make this appointment without problems. She was provided with a 14 days worth, supply samples of her [facility] discharge  medications. She left [facility] in no apparent distress. Transportation per family.  Consults:  psychiatry  Significant Diagnostic Studies:  labs: CBC with diff, CMP, UDS, toxicology tests, U/A, results reviewed, stable  Discharge Vitals:   Blood pressure 103/67, pulse 89, temperature 98 F (36.7 C), temperature source Oral, resp. rate 16, height _0  (1.626 m), weight 61.236 kg (135 lb), SpO2 100 %. Body mass index is 23.16 kg/(m^2). Lab Results:   Results for orders placed or performed during the hospital encounter of 05/25/14 (from the past 72 hour(s))  Basic metabolic panel     Status: None   Collection Time: 05/27/14  7:30 PM  Result Value Ref Range   Sodium 138 135 - 145 mmol/L   Potassium 3.6 3.5 - 5.1 mmol/L   Chloride 101 96 - 112 mmol/L   CO2 29 19 - 32 mmol/L   Glucose, Bld 78 70 - 99 mg/dL   BUN 16 6 - 23 mg/dL   Creatinine, Ser 0.74 0.50 - 1.10 mg/dL   Calcium 9.2 8.4 - 10.5 mg/dL   GFR calc non Af Amer >90 >90 mL/min   GFR calc Af Amer >90 >90 mL/min    Comment: (NOTE) The eGFR has been calculated using the CKD EPI equation. This calculation has not been validated in all clinical situations. eGFR's persistently <90 mL/min signify possible Chronic Kidney Disease.    Anion gap 8 5 - 15    Comment: Performed at [facility]  Valproic acid level     Status: None   Collection Time: 05/30/14  6:45 AM  Result Value Ref Range   Valproic Acid Lvl 64.9 50.0 - 100.0 ug/mL    Comment: Performed at [facility]    Physical Findings: AIMS: Facial and Oral Movements Muscles of Facial Expression: None, normal Lips and Perioral Area: None, normal Jaw: None, normal Tongue: None, normal,Extremity Movements Upper (arms, wrists, hands, fingers): None, normal Lower (legs, knees, ankles, toes): None, normal, Trunk Movements Neck, shoulders, hips: None, normal, Overall Severity Severity of abnormal movements (highest score from questions above): None,  normal Incapacitation due to abnormal movements: None, normal Patient's awareness of abnormal movements (rate only patient's report): No Awareness, Dental Status Current problems with teeth and/or dentures?: No Does patient usually wear dentures?: No  CIWA:  CIWA-Ar Total: 1 COWS:  COWS Total Score: 2  See Psychiatric Specialty Exam and Suicide Risk Assessment completed by Attending Physician prior to discharge.  Discharge destination:  Home  Is patient on multiple antipsychotic therapies at discharge:  No    Has Patient had three or more failed trials of antipsychotic monotherapy by history:  No  Recommended Plan for Multiple Antipsychotic Therapies: NA    Medication List    STOP taking these medications        benztropine 0.5 MG  tablet  Commonly known as:  COGENTIN     divalproex 250 MG DR tablet  Commonly known as:  DEPAKOTE  Replaced by:  divalproex 500 MG 24 hr tablet     OLANZapine 2.5 MG tablet  Commonly known as:  ZYPREXA     traZODone 50 MG tablet  Commonly known as:  DESYREL      TAKE these medications      Indication   divalproex 500 MG 24 hr tablet  Commonly known as:  DEPAKOTE ER  Take 1 tablet (500 mg total) by mouth at bedtime.   Indication:  For mood stabilization     gabapentin 300 MG capsule  Commonly known as:  NEURONTIN  Take 1 capsule (300 mg total) by mouth 3 (three) times daily. For agitation   Indication:  Agitation     hydrOXYzine 50 MG tablet  Commonly known as:  ATARAX/VISTARIL  Take 1 tablet (50 mg total) by mouth 2 (two) times daily as needed for anxiety.   Indication:  Anxiety     QUEtiapine 50 MG tablet  Commonly known as:  SEROQUEL  Take 3 tablets (150 mg total) by mouth at bedtime. For mood control   Indication:  Mood control     QUEtiapine 25 MG tablet  Commonly known as:  SEROQUEL  Take 1 tablet (25 mg total) by mouth 3 (three) times daily as needed (for severe anxiety/agitation).   Indication:  Agitation        Follow-up Information    Follow up with [facility].   Why:  Go to the walk-in clinic M-F between 8 and 10AM for your hospital follow up appointment.  Be prepared to spend several hours there.  Fridays are the lightest day generally   Contact information:   [place]      Follow up with [facility] team.   Why:  Call Colletta [place] to set up an assessment for services   Contact information:   [address]  [place]  [phone]    Follow-up recommendations: Activity:  As tolerated Diet: As recommended by your primary care doctor. Keep all scheduled follow-up appointments as recommended.   Comments: Take all your medications as prescribed by your mental healthcare provider. Report any adverse effects and or reactions from your medicines to your outpatient provider promptly. Patient is instructed and cautioned to not engage in alcohol and or illegal drug use while on prescription medicines. In the event of worsening symptoms, patient is instructed to call the crisis hotline, 911 and or go to the nearest ED for appropriate evaluation and treatment of symptoms. Follow-up with your primary care provider for your other medical issues, concerns and or health care needs.   Total Discharge Time: Greater than 30 minutes  Signed: Encarnacion Slates, PMHNP-BC 05/30/2014, 7:02 PM

## 2014-05-30 NOTE — [facility] Suicide Risk Assessment (Signed)
[facility] INPATIENT:  Family/Significant Other Suicide Prevention Education  Suicide Prevention Education:  Education Completed; No one has been identified by the patient as the family member/significant other with whom the patient will be residing, and identified as the person(s) who will aid the patient in the event of a mental health crisis (suicidal ideations/suicide attempt).  With written consent from the patient, the family member/significant other has been provided the following suicide prevention education, prior to the and/or following the discharge of the patient.  The suicide prevention education provided includes the following:  Suicide risk factors  Suicide prevention and interventions  National Suicide Hotline telephone number  [facility] assessment telephone number  [facility] Assistance 911  [facility] and/or Residential Mobile Crisis Unit telephone number  Request made of family/significant other to:  Remove weapons (e.g., guns, rifles, knives), all items previously/currently identified as safety concern.    Remove drugs/medications (over-the-counter, prescriptions, illicit drugs), all items previously/currently identified as a safety concern.  The family member/significant other verbalizes understanding of the suicide prevention education information provided.  The family member/significant other agrees to remove the items of safety concern listed above. The patient did not endorse SI at the time of admission, nor did the patient c/o SI during the stay here.  SPE not required.   Daryel Geraldorth, Pallas Wahlert B 05/30/2014, 10:27 AM

## 2014-05-30 NOTE — Progress Notes (Signed)
  [facility] :  Will you be returning to the same living situation after discharge:  No. At discharge, do you have transportation home?: Yes,  bus pass Do you have the ability to pay for your medications: Yes,  MCD  Release of information consent forms completed and in the chart;  Patient's signature needed at discharge.  Patient to Follow up at: Follow-up Information    Follow up with [facility].   Why:  Go to the walk-in clinic M-F between 8 and 10AM for your hospital follow up appointment.  Be prepared to spend several hours there.  Fridays are the lightest day generally   Contact information:   [address]  [place]  [phone]      Follow up with [facility].   Why:  Call Judeth CornfieldStephanie to set up an assessment for services   Contact information:   [address]  [place]  [336] [phone]      Patient denies SI/HI: Yes,  yes    Safety Planning and Suicide Prevention discussed: Yes,  yes  Have you used any form of tobacco in the last 30 days? (Cigarettes, Smokeless Tobacco, Cigars, and/or Pipes): Yes  Has patient been referred to the Quitline?: Patient refused referral  Ida Rogueorth, Kendahl Bumgardner B 05/30/2014, 10:21 AM

## 2014-05-30 NOTE — [facility] Suicide Risk Assessment (Signed)
[facility]   Demographic Factors:  Caucasian and Unemployed  Total Time spent with patient: 30 minutes  Musculoskeletal: Strength & Muscle Tone: within normal limits Gait & Station: normal Patient leans: N/A  Psychiatric Specialty Exam: Physical Exam  Review of Systems  Constitutional: Negative.   HENT: Negative.   Eyes: Negative.   Respiratory: Negative.   Cardiovascular: Negative.   Gastrointestinal: Negative.   Genitourinary: Negative.   Musculoskeletal: Negative.   Skin: Negative.   Neurological: Negative.   Psychiatric/Behavioral: Positive for substance abuse (stable). Negative for depression, suicidal ideas and hallucinations. The patient is not nervous/anxious and does not have insomnia.     Blood pressure 103/67, pulse 89, temperature 98 F (36.7 C), temperature source Oral, resp. rate 16, height 5\' 4"  (1.626 m), weight 61.236 kg (135 lb), SpO2 100 %.Body mass index is 23.16 kg/(m^2).  General Appearance: Casual  Eye Contact::  Fair  Speech:  Clear and Coherent409  Volume:  Normal  Mood:  Euthymic  Affect:  Congruent  Thought Process:  Goal Directed  Orientation:  Full (Time, Place, and Person)  Thought Content:  WDL  Suicidal Thoughts:  No  Homicidal Thoughts:  No  Memory:  Immediate;   Fair Recent;   Fair Remote;   Fair  Judgement:  Fair  Insight:  Shallow  Psychomotor Activity:  Normal  Concentration:  Fair  Recall:  FiservFair  Fund of Knowledge:Fair  Language: Fair  Akathisia:  No  Handed:  Right  AIMS (if indicated):     Assets:  Communication Skills Desire for Improvement  Sleep:  Number of Hours: 6.25  Cognition: WNL  ADL's:  Intact   Have you used any form of tobacco in the last 30 days? (Cigarettes, Smokeless Tobacco, Cigars, and/or Pipes): Yes  Has this patient used any form of tobacco in the last 30 days? (Cigarettes, Smokeless Tobacco, Cigars, and/or Pipes) Yes, A prescription for an FDA-approved tobacco cessation  medication was offered at discharge and the patient refused  Mental Status Per Nursing Assessment::   On Admission:     Current Mental Status by Physician: pt denies SI/HI/AH/VH  Loss Factors: Legal issues, Financial problems/change in socioeconomic status and DSS involvement for her child  Historical Factors: Impulsivity  Risk Reduction Factors:   Positive social support  Continued Clinical Symptoms:  Alcohol/Substance Abuse/Dependencies Previous Psychiatric Diagnoses and Treatments  Cognitive Features That Contribute To Risk:  Polarized thinking    Suicide Risk:  Minimal: No identifiable suicidal ideation.  Patients presenting with no risk factors but with morbid ruminations; may be classified as minimal risk based on the severity of the depressive symptoms  Principal Problem: Bipolar disorder, current episode mixed, severe, with psychotic features Discharge Diagnoses:  Patient Active Problem List   Diagnosis Date Noted  . Bipolar disorder, current episode mixed, severe, with psychotic features [F31.64] 05/26/2014  . Cannabis use disorder, mild, abuse [F12.10]   . Encounter for female sterilization procedure [Z30.2] 07/22/2013  . Chronic pelvic pain in female [N94.9, G89.29] 04/07/2012    Follow-up Information    Follow up with [facility].   Why:  Go to the walk-in clinic M-F between 8 and 10AM for your hospital follow up appointment.  Be prepared to spend several hours there.  Fridays are the lightest day generally   Contact information:   [address]  [place]  [phone]      Follow up with [facility] team.   Why:  Call Judeth CornfieldStephanie to  set up an assessment for services   Contact information:   [address]  [place]  [[phone]      Plan Of Care/Follow-up recommendations:  Activity:  no restrictions Diet:  regular Tests:  as needed- Depakote level is therapeutic Other:  follow up with after care  Is patient on multiple  antipsychotic therapies at discharge:  No   Has Patient had three or more failed trials of antipsychotic monotherapy by history:  No  Recommended Plan for Multiple Antipsychotic Therapies: NA    Joesphine Schemm md 05/30/2014, 11:55 AM

## 2014-05-30 NOTE — Progress Notes (Signed)
Patient stated she slept approximately 6 hours last night.  Stated she is feeling much better today.

## 2014-05-30 NOTE — Progress Notes (Signed)
Discharge Note:  Patient discharged with 2 bus tickets.  Denied SI and HI.  Denied A/V hallucinations.  Denied pain.  Suicide prevention information given and discussed with patient who stated she understood and had no questions.  Patient stated she received all her belongings, clothing, misc items, toiletries, prescriptions, medications, purple bag.  Patient stated she appreciated all assistance [facility].

## 2014-05-30 NOTE — Progress Notes (Signed)
D. Pt had been up and visible in milieu this evening, participating in various activities. Pt has appeared hyperactive and restless on the unit and has spoken about not being able to sleep well. Pt also spoke about how she feels ready for discharge soon as she feels medications are helpful. Pt still presents with flight of ideas with pressured speech. Pt did receive medications without incident. A. Support and encouragement provided. R. Safety maintained, will continue to monitor.

## 2014-05-30 NOTE — Tx Team (Signed)
  Interdisciplinary Treatment Plan Update   Date Reviewed:  05/30/2014  Time Reviewed:  10:12 AM  Progress in Treatment:   Attending groups: Yes Participating in groups: Yes Taking medication as prescribed: Yes  Tolerating medication: Yes Family/Significant other contact made: Yes  Patient understands diagnosis: Yes  Discussing patient identified problems/goals with staff: Yes  See initial care plan Medical problems stabilized or resolved: Yes Denies suicidal/homicidal ideation: Yes  In tx team Patient has not harmed self or others: Yes  For review of initial/current patient goals, please see plan of care.  Estimated Length of Stay:  D/C today  Reason for Continuation of Hospitalization:   New Problems/Goals identified:  N/A  Discharge Plan or Barriers:   stay with a friend, follow up outpt  Additional Comments:  Attendees:  Signature: Ivin BootySarama Eappen, MD 05/30/2014 10:12 AM   Signature: Richelle Itood Tyaira Heward, LCSW 05/30/2014 10:12 AM  Signature: Fransisca KaufmannLaura Davis, NP 05/30/2014 10:12 AM  Signature: Joslyn Devonaroline Beaudry, RN 05/30/2014 10:12 AM  Signature: Liborio NixonPatrice White, RN 05/30/2014 10:12 AM  Signature:  05/30/2014 10:12 AM  Signature:   05/30/2014 10:12 AM  Signature:    Signature:    Signature:    Signature:    Signature:    Signature:      Scribe for Treatment Team:   Richelle Itood Davine Sweney, LCSW  05/30/2014 10:12 AM

## 2014-06-02 NOTE — Progress Notes (Signed)
Patient Discharge Instructions:  After Visit Summary (AVS):   Faxed to:  06/02/14 Discharge Summary Note:   Faxed to:  06/02/14 Psychiatric Admission Assessment Note:   Faxed to:  06/02/14 Suicide Risk Assessment - Discharge Assessment:   Faxed to:  06/02/14 Faxed/Sent to the Next Level Care provider:  06/02/14  [facility] to [facility] @ [phone] Faxed to [facility] @ [phone]  Jerelene ReddenSheena E [place], 06/02/2014, 2:17 PM

## 2014-12-15 IMAGING — US US PELVIS COMPLETE
1 series · 14 of 25 positions shown · non-contrast
Comparison: None.

CLINICAL DATA: Chronic pelvic pain.  Irregular menses.



[Series 1: us pelvis complete · 14 of 104 slices shown]
[im 1/104]
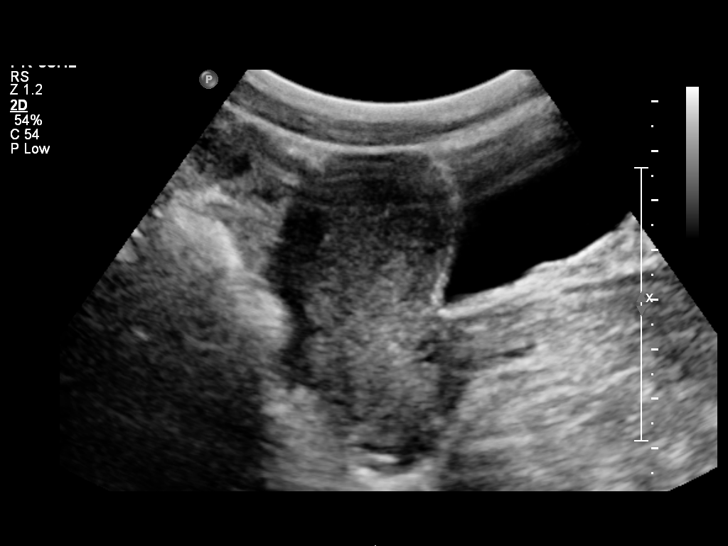
[im 9/104]
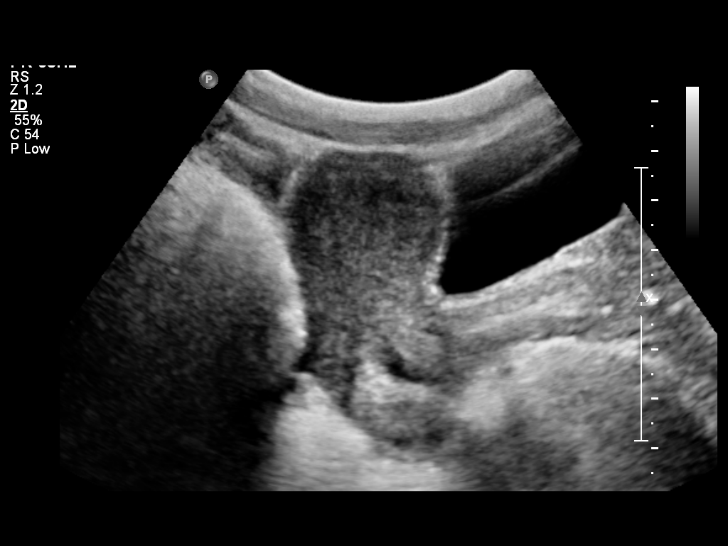
[im 18/104]
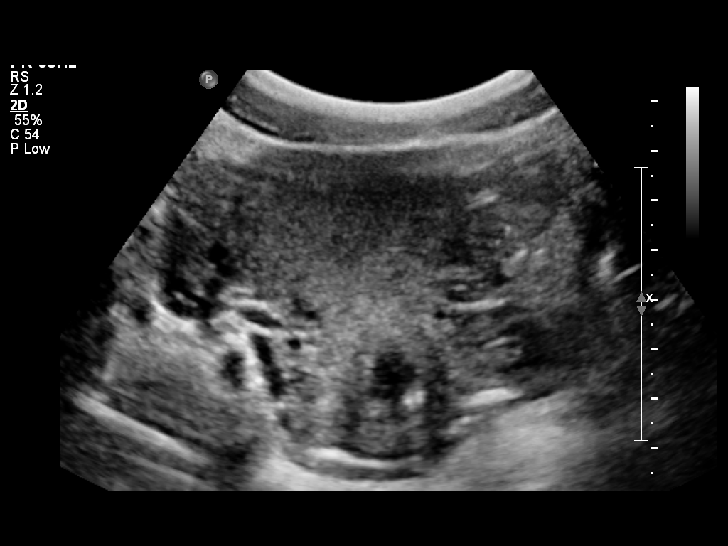
[im 26/104]
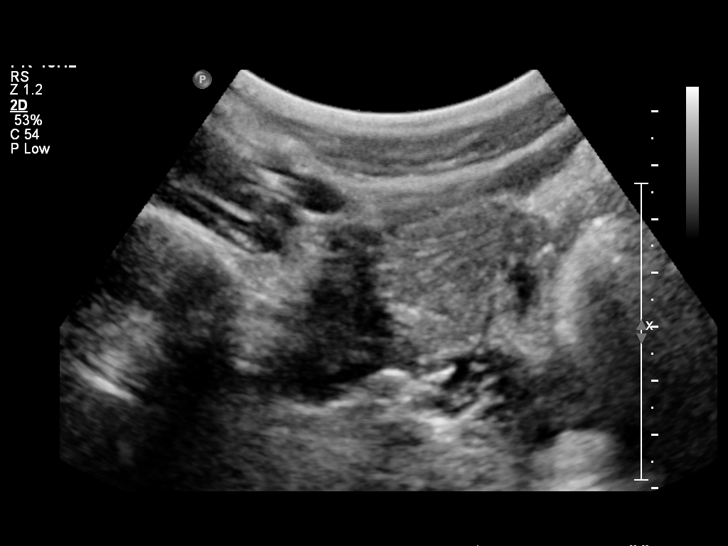
[im 35/104]
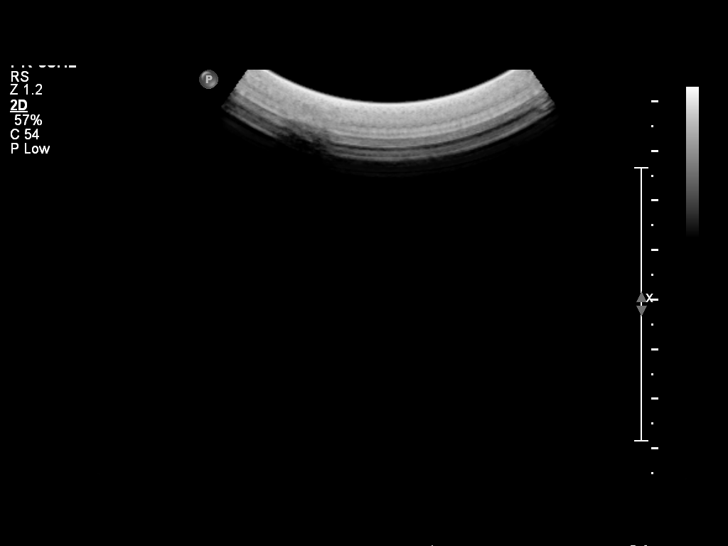
[im 39/104]
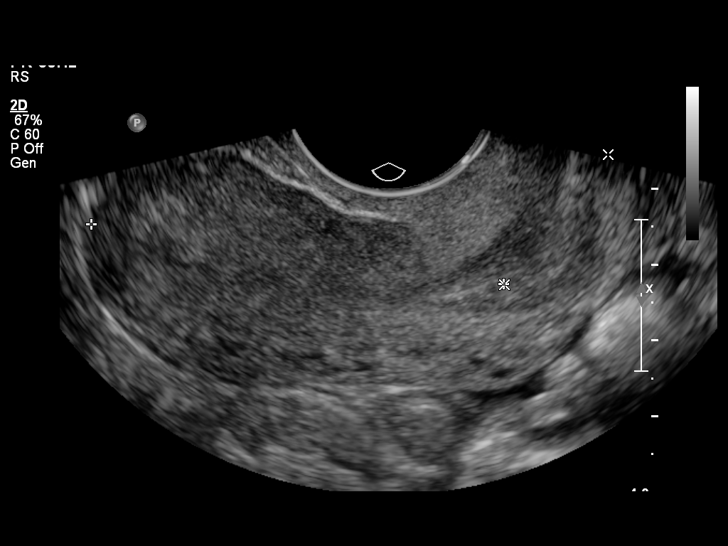
[im 48/104]
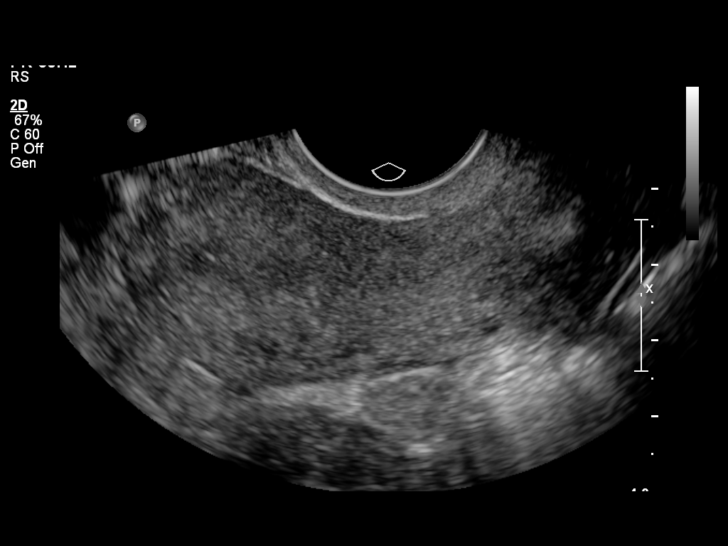
[im 56/104]
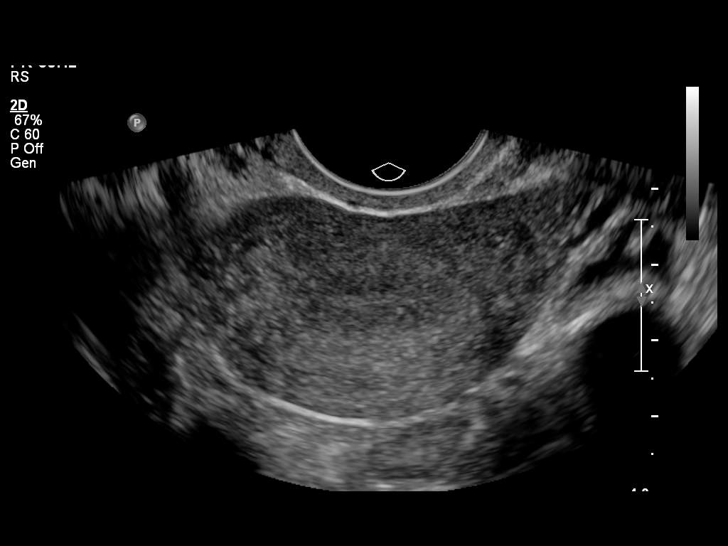
[im 65/104]
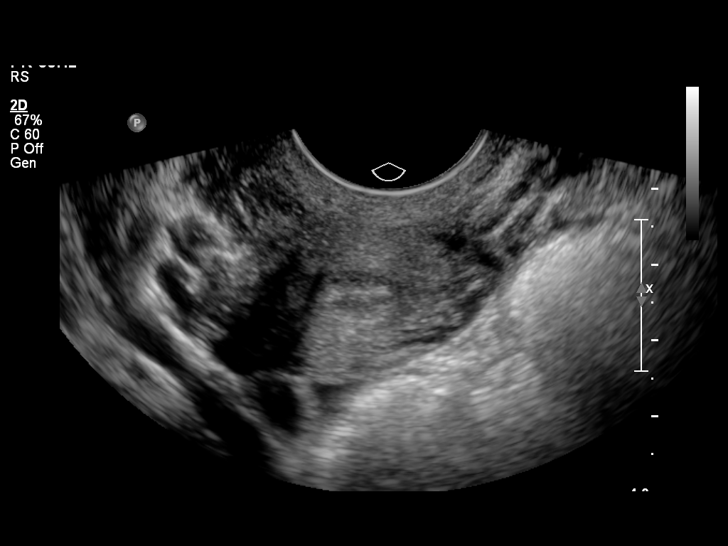
[im 69/104]
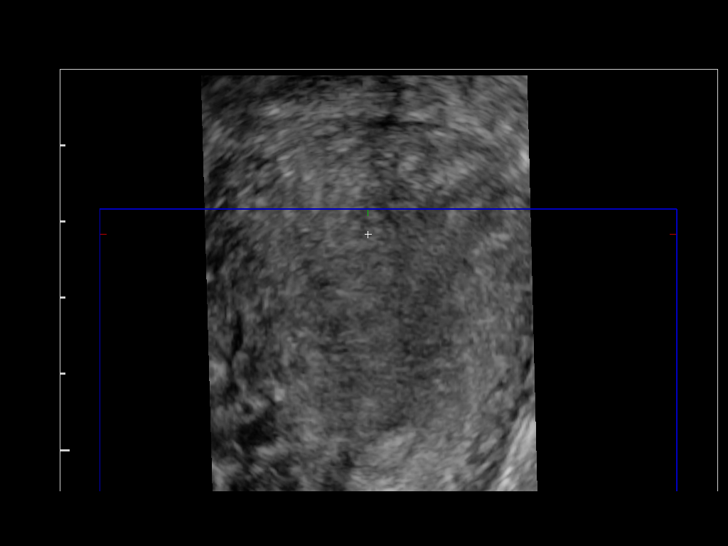
[im 78/104]
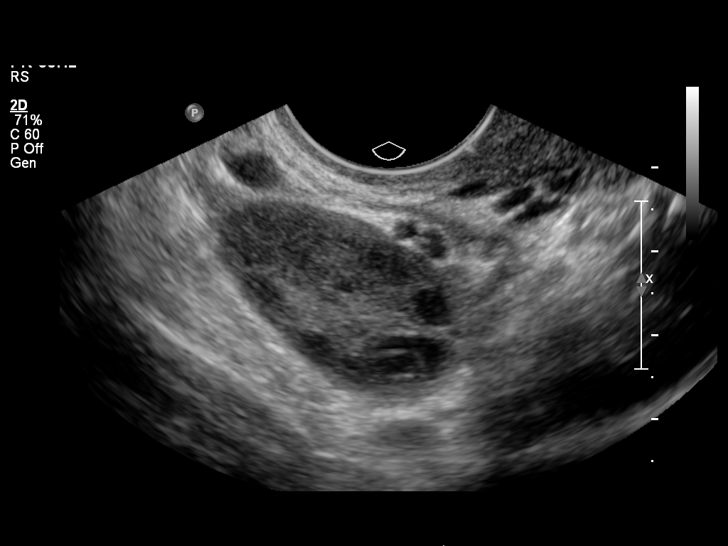
[im 86/104]
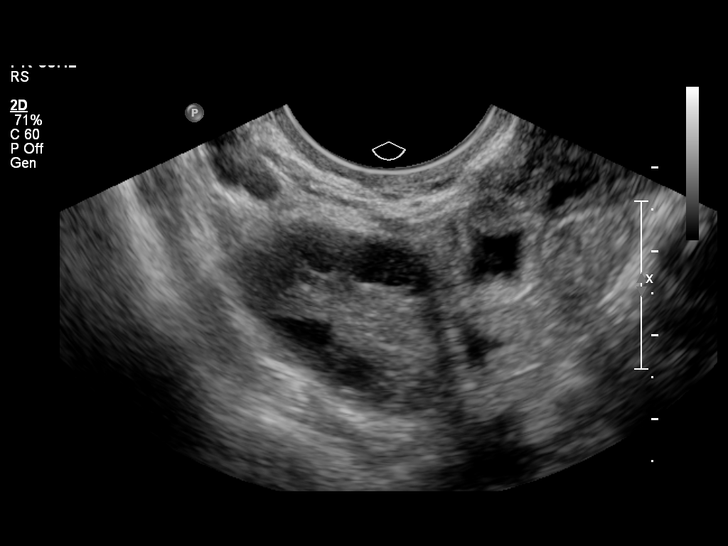
[im 95/104]
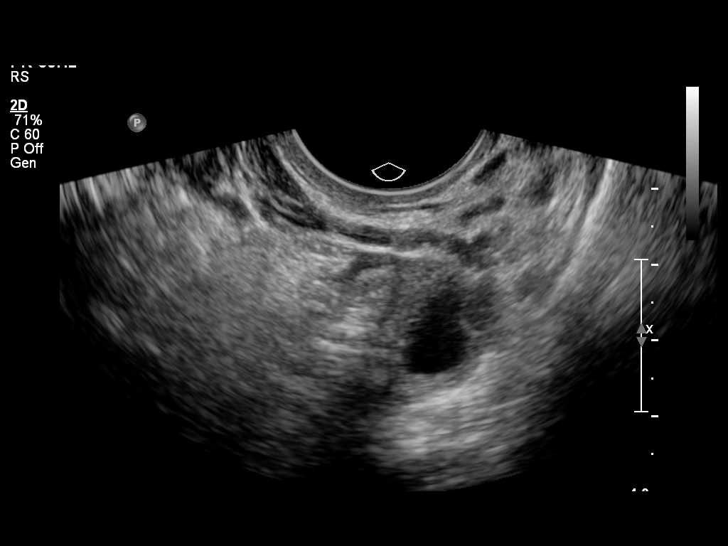
[im 104/104]
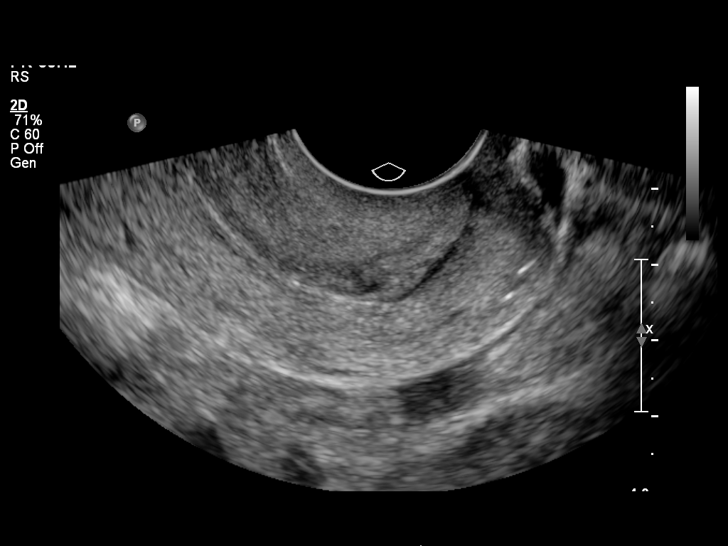

[14 of 25 positions shown; findings below may reference images not displayed]

FINDINGS: Uterus:  7.6 x 3.0 x 4.5 cm. No fibroids or other uterine mass
identified.

Endometrium: Double layer thickness measures 2 mm transvaginally.
No focal lesion visualized.

Right ovary: 3.3 x 1.8 x 2.2 cm.  Normal appearance.  No ovarian or
adnexal mass identified peri

Left ovary: 3.5 x 2.0 x 1.9 cm.  Normal appearance. Dominant 11 mm
follicle noted.  No ovarian or adnexal mass identified.

Other Findings:  No free fluid
IMPRESSION: Negative.  No evidence of pelvic mass or other significant
abnormality.

## 2015-12-22 IMAGING — CR DG CHEST 2V
2 series · 2 of 2 positions shown · non-contrast
Comparison: None.

CLINICAL DATA: Cough.  Smoker.

EXAM:
CHEST  2 VIEW

[view not recorded (1 of 2)]
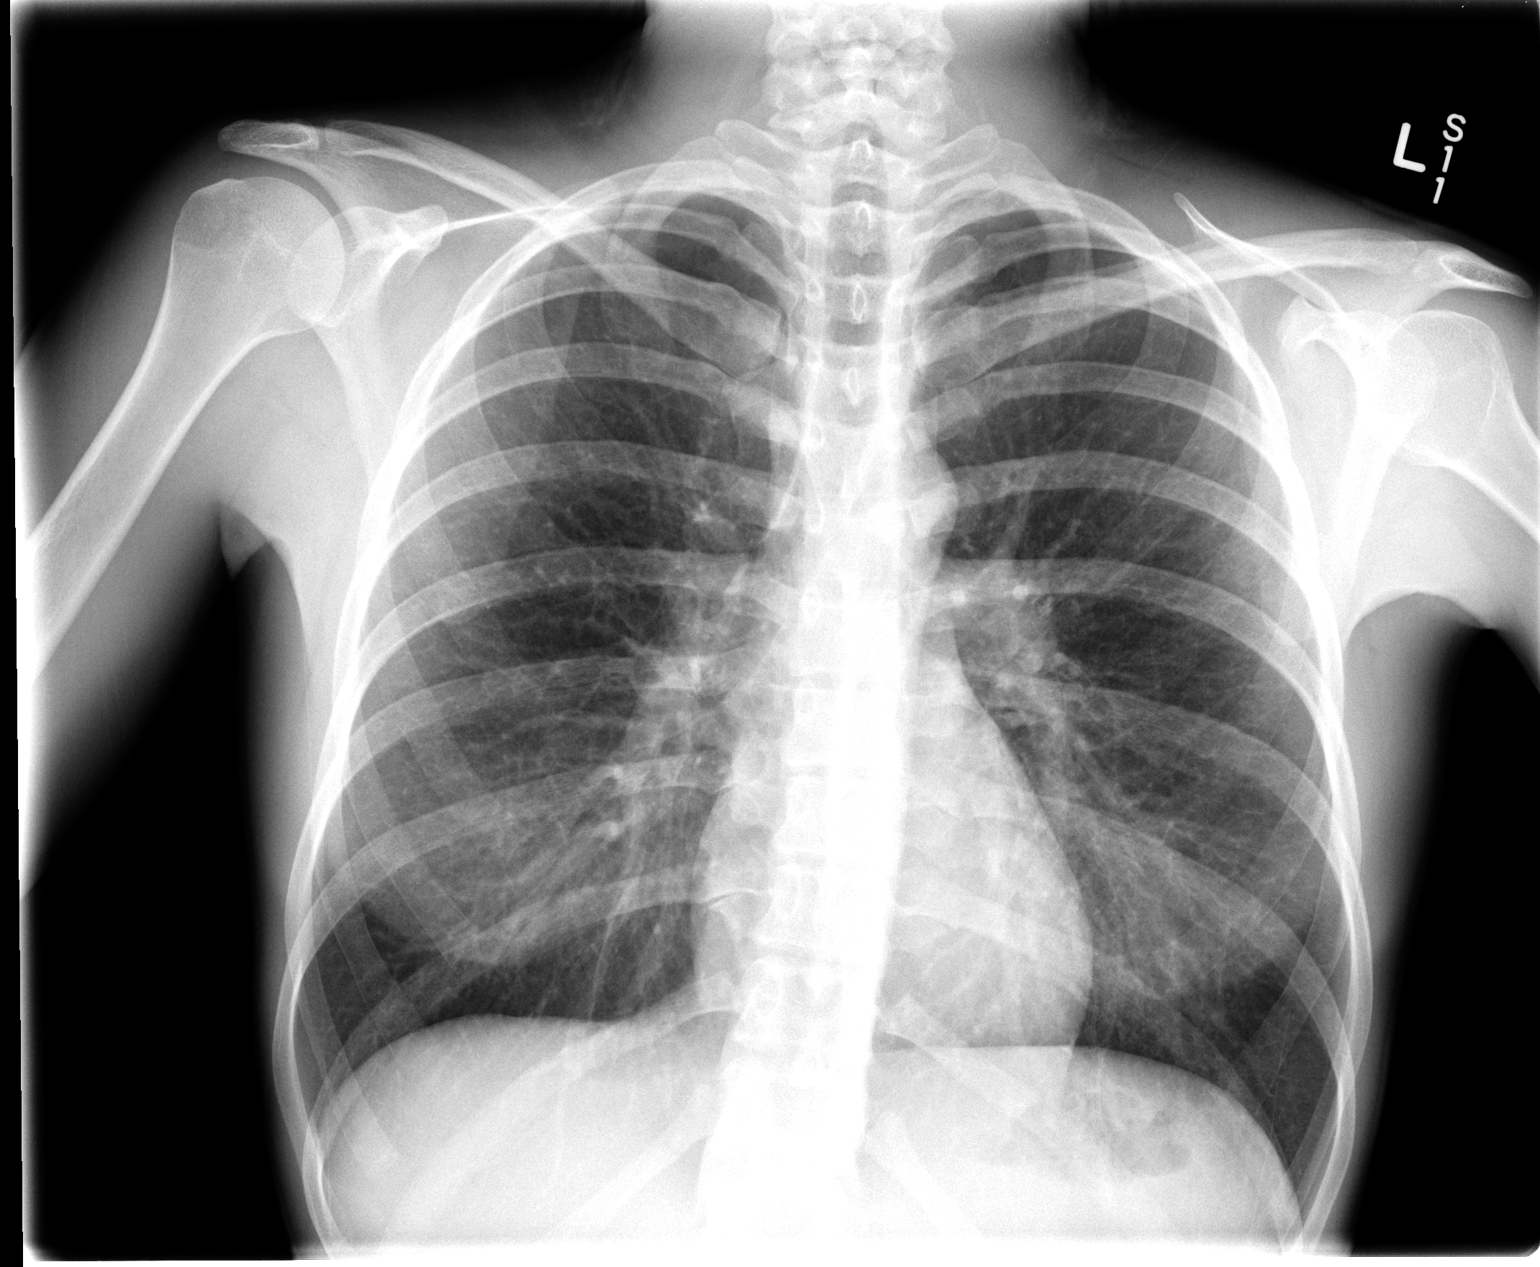

[view not recorded (2 of 2)]
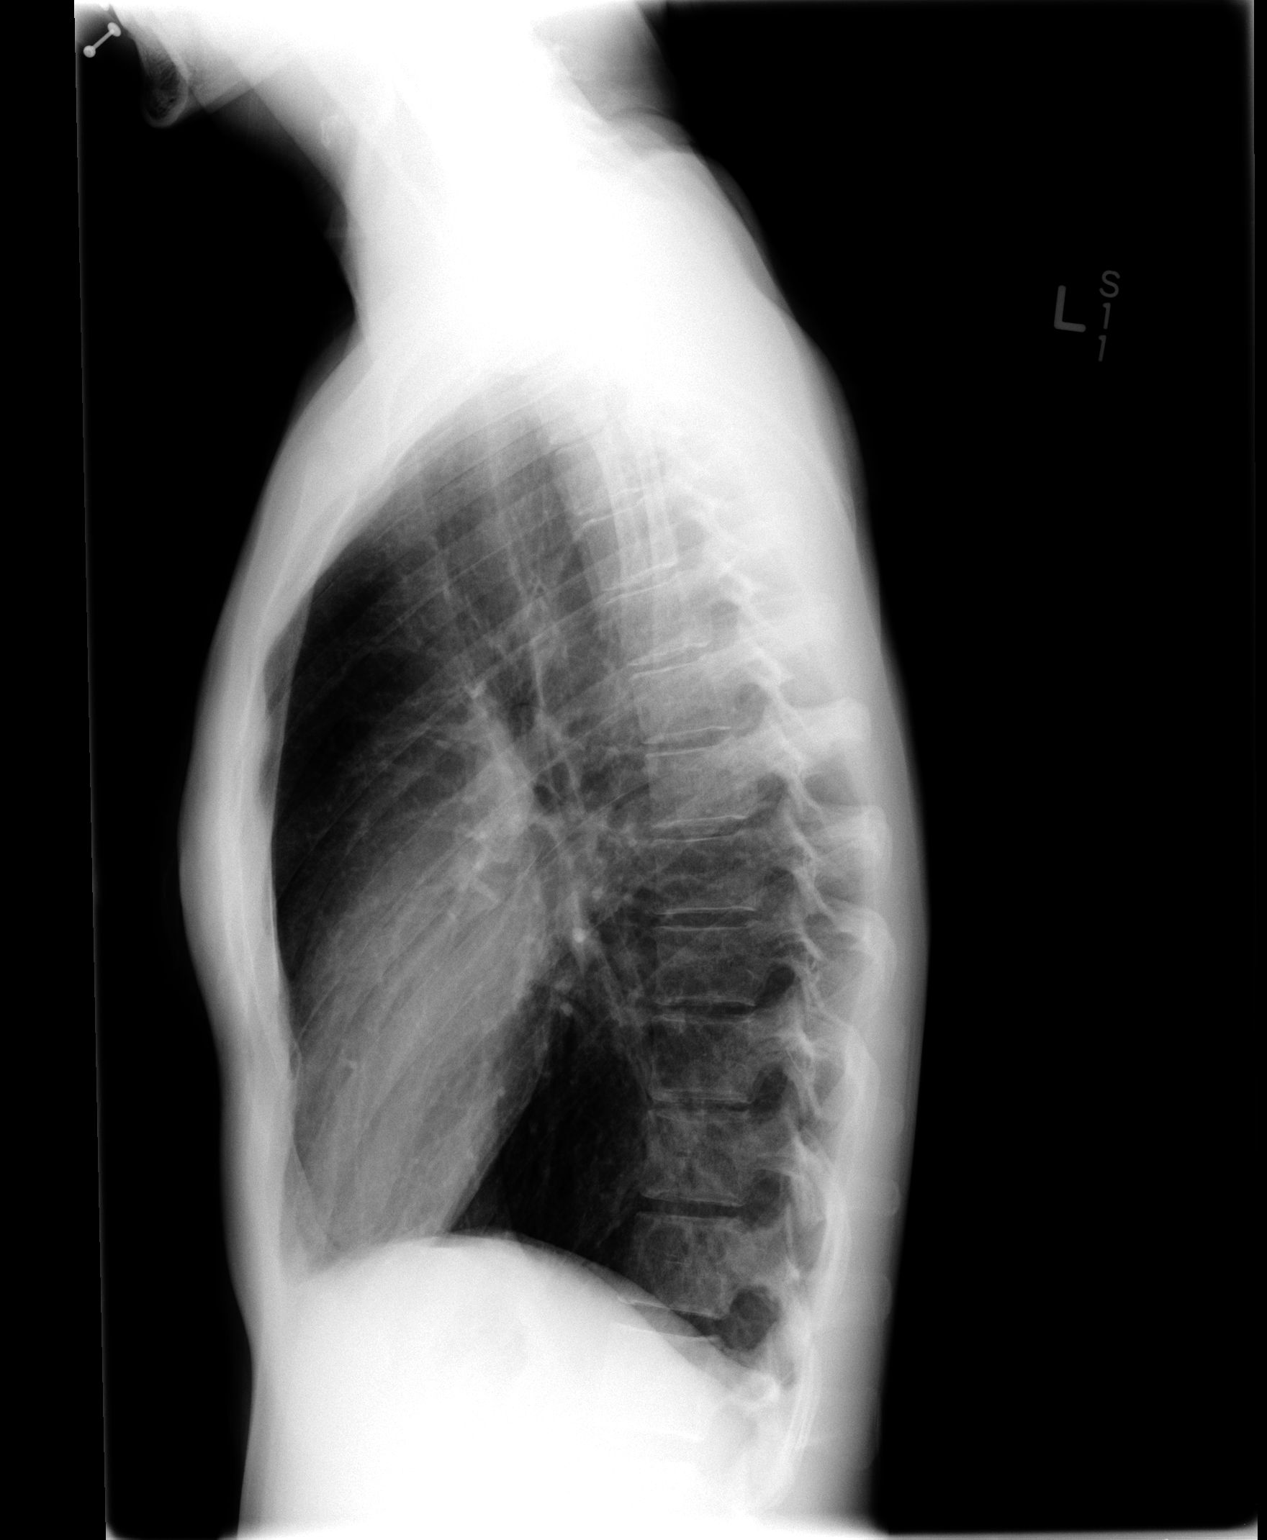

[2 of 2 positions shown; findings below may reference images not displayed]

FINDINGS: Heart size is normal. Mild hyperinflation noted. No evidence of
pulmonary infiltrate or edema. No evidence of pleural effusion. No
mass or lymphadenopathy identified.
IMPRESSION: Mild hyperinflation.  No active cardiopulmonary disease.

## 2017-01-13 ENCOUNTER — Inpatient Hospital Stay (HOSPITAL_COMMUNITY)
Admission: RE | Admit: 2017-01-13 | Discharge: 2017-01-16 | DRG: 885 | Disposition: A | Discharge status: Home / Self Care | Payer: Medicare Other | Attending: Psychiatry | Admitting: Psychiatry

## 2017-01-13 ENCOUNTER — Other Ambulatory Visit: Payer: Self-pay

## 2017-01-13 ENCOUNTER — Encounter (HOSPITAL_COMMUNITY): Payer: Self-pay

## 2017-01-13 DIAGNOSIS — Z91410 Personal history of adult physical and sexual abuse: Secondary | ICD-10-CM | POA: Diagnosis not present

## 2017-01-13 DIAGNOSIS — R45851 Suicidal ideations: Secondary | ICD-10-CM | POA: Diagnosis present

## 2017-01-13 DIAGNOSIS — F4310 Post-traumatic stress disorder, unspecified: Secondary | ICD-10-CM | POA: Diagnosis present

## 2017-01-13 DIAGNOSIS — F17210 Nicotine dependence, cigarettes, uncomplicated: Secondary | ICD-10-CM | POA: Diagnosis present

## 2017-01-13 DIAGNOSIS — F600 Paranoid personality disorder: Secondary | ICD-10-CM | POA: Diagnosis present

## 2017-01-13 DIAGNOSIS — R102 Pelvic and perineal pain: Secondary | ICD-10-CM | POA: Diagnosis present

## 2017-01-13 DIAGNOSIS — Z813 Family history of other psychoactive substance abuse and dependence: Secondary | ICD-10-CM | POA: Diagnosis not present

## 2017-01-13 DIAGNOSIS — F1210 Cannabis abuse, uncomplicated: Secondary | ICD-10-CM | POA: Diagnosis present

## 2017-01-13 DIAGNOSIS — G4700 Insomnia, unspecified: Secondary | ICD-10-CM | POA: Diagnosis present

## 2017-01-13 DIAGNOSIS — F419 Anxiety disorder, unspecified: Secondary | ICD-10-CM | POA: Diagnosis not present

## 2017-01-13 DIAGNOSIS — Z818 Family history of other mental and behavioral disorders: Secondary | ICD-10-CM | POA: Diagnosis not present

## 2017-01-13 DIAGNOSIS — G8929 Other chronic pain: Secondary | ICD-10-CM | POA: Diagnosis present

## 2017-01-13 DIAGNOSIS — F250 Schizoaffective disorder, bipolar type: Secondary | ICD-10-CM | POA: Diagnosis not present

## 2017-01-13 DIAGNOSIS — Z9851 Tubal ligation status: Secondary | ICD-10-CM | POA: Diagnosis not present

## 2017-01-13 DIAGNOSIS — Z888 Allergy status to other drugs, medicaments and biological substances status: Secondary | ICD-10-CM | POA: Diagnosis not present

## 2017-01-13 DIAGNOSIS — R450 Nervousness: Secondary | ICD-10-CM | POA: Diagnosis not present

## 2017-01-13 DIAGNOSIS — F259 Schizoaffective disorder, unspecified: Secondary | ICD-10-CM | POA: Diagnosis present

## 2017-01-13 LAB — LIPID PANEL
CHOL/HDL RATIO: 4.1 ratio
CHOLESTEROL: 199 mg/dL (ref 0–200)
HDL: 49 mg/dL (ref >40)
LDL Cholesterol: 140 mg/dL — ABNORMAL HIGH (ref 0–99)
Triglycerides: 48 mg/dL (ref <150)
VLDL: 10 mg/dL (ref 0–40)

## 2017-01-13 LAB — COMPREHENSIVE METABOLIC PANEL
ALT: 15 U/L (ref 14–54)
ANION GAP: 10 (ref 5–15)
AST: 28 U/L (ref 15–41)
Albumin: 4.7 g/dL (ref 3.5–5.0)
Alkaline Phosphatase: 90 U/L (ref 38–126)
BUN: 17 mg/dL (ref 6–20)
CHLORIDE: 95 mmol/L — AB (ref 101–111)
CO2: 28 mmol/L (ref 22–32)
Calcium: 9.2 mg/dL (ref 8.9–10.3)
Creatinine, Ser: 0.63 mg/dL (ref 0.44–1.00)
Glucose, Bld: 111 mg/dL — ABNORMAL HIGH (ref 65–99)
Potassium: 3.8 mmol/L (ref 3.5–5.1)
SODIUM: 133 mmol/L — AB (ref 135–145)
Total Bilirubin: 1.4 mg/dL — ABNORMAL HIGH (ref 0.3–1.2)
Total Protein: 8.3 g/dL — ABNORMAL HIGH (ref 6.5–8.1)

## 2017-01-13 LAB — CBC
HCT: 43.9 % (ref 36.0–46.0)
Hemoglobin: 14.9 g/dL (ref 12.0–15.0)
MCH: 29.8 pg (ref 26.0–34.0)
MCHC: 33.9 g/dL (ref 30.0–36.0)
MCV: 87.8 fL (ref 78.0–100.0)
PLATELETS: 525 10*3/uL — AB (ref 150–400)
RBC: 5 MIL/uL (ref 3.87–5.11)
RDW: 12.5 % (ref 11.5–15.5)
WBC: 9.2 10*3/uL (ref 4.0–10.5)

## 2017-01-13 LAB — HEMOGLOBIN A1C
HEMOGLOBIN A1C: 5.2 % (ref 4.8–5.6)
MEAN PLASMA GLUCOSE: 102.54 mg/dL

## 2017-01-13 LAB — TSH: TSH: 0.5 u[IU]/mL (ref 0.350–4.500)

## 2017-01-13 MED ORDER — ALUM & MAG HYDROXIDE-SIMETH 200-200-20 MG/5ML PO SUSP
30.0000 mL | ORAL | Status: DC | PRN
  Filled 2017-01-13: qty 30

## 2017-01-13 MED ORDER — TRAZODONE HCL 50 MG PO TABS
50.0000 mg | ORAL_TABLET | Freq: Every evening | ORAL | Status: DC | PRN
  Administered 2017-01-14: 50 mg via ORAL
  Filled 2017-01-13: qty 1

## 2017-01-13 MED ORDER — GABAPENTIN 300 MG PO CAPS
300.0000 mg | ORAL_CAPSULE | Freq: Three times a day (TID) | ORAL | Status: DC
  Administered 2017-01-13 – 2017-01-16 (×9): 300 mg via ORAL
  Filled 2017-01-13 (×17): qty 1

## 2017-01-13 MED ORDER — BENZTROPINE MESYLATE 0.5 MG PO TABS
0.5000 mg | ORAL_TABLET | Freq: Every day | ORAL | Status: DC
  Administered 2017-01-13 – 2017-01-15 (×3): 0.5 mg via ORAL
  Filled 2017-01-13 (×5): qty 1

## 2017-01-13 MED ORDER — OLANZAPINE 10 MG PO TABS
10.0000 mg | ORAL_TABLET | Freq: Every day | ORAL | Status: DC
  Administered 2017-01-13 – 2017-01-14 (×2): 10 mg via ORAL
  Filled 2017-01-13 (×4): qty 1

## 2017-01-13 MED ORDER — ACETAMINOPHEN 325 MG PO TABS
650.0000 mg | ORAL_TABLET | Freq: Four times a day (QID) | ORAL | Status: DC | PRN
  Filled 2017-01-13: qty 2

## 2017-01-13 MED ORDER — MAGNESIUM HYDROXIDE 400 MG/5ML PO SUSP
30.0000 mL | Freq: Every day | ORAL | Status: DC | PRN
Start: 2017-01-13 — End: 2017-01-16
  Filled 2017-01-13: qty 30

## 2017-01-13 MED ORDER — NICOTINE 21 MG/24HR TD PT24
21.0000 mg | MEDICATED_PATCH | Freq: Every day | TRANSDERMAL | Status: DC
  Administered 2017-01-14 – 2017-01-16 (×3): 21 mg via TRANSDERMAL
  Filled 2017-01-13 (×7): qty 1

## 2017-01-13 NOTE — Progress Notes (Signed)
Patient ID: [name], female   DOB: Nov 13, 1985, 31 y.o.   MRN: [id number] Per State regulations 482.30 this chart was reviewed for medical necessity with respect to the patient's admission/duration of stay.    Next review date: 01/16/17  Thurman CoyerEric Jamilah Jean, BSN, RN-BC  Case Manager
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
## 2017-01-13 NOTE — H&P (Signed)
Behavioral Health Medical Screening Exam  Tara Thompson is an 31 y.o. female.  Total Time spent with patient: 30 minutes  Psychiatric Specialty Exam: Physical Exam  Vitals reviewed. Cardiovascular: Normal rate.  Neurological: She is alert.  Psychiatric: She has a normal mood and affect. Her behavior is normal.    Review of Systems  Psychiatric/Behavioral: Positive for depression and suicidal ideas. The patient is nervous/anxious.     There were no vitals taken for this visit.There is no height or weight on file to calculate BMI.  General Appearance: Disheveled  Eye Contact:  Fair  Speech:  Clear and Coherent  Volume:  Normal  Mood:  Anxious, Depressed and Dysphoric  Affect:  Depressed and Flat  Thought Process:  Coherent  Orientation:  Full (Time, Place, and Person)  Thought Content:  Hallucinations: Auditory  Suicidal Thoughts:  Yes.  with intent/plan  Homicidal Thoughts:  No  Memory:  Immediate;   Fair Recent;   Fair Remote;   Fair  Judgement:  Intact  Insight:  Lacking  Psychomotor Activity:  Normal  Concentration: Concentration: Fair  Recall:  FiservFair  Fund of Knowledge:Fair  Language: Fair  Akathisia:  No  Handed:  Right  AIMS (if indicated):     Assets:  Communication Skills Desire for Improvement Social Support  Sleep:       Musculoskeletal: Strength & Muscle Tone: within normal limits Gait & Station: normal Patient leans: N/A  There were no vitals taken for this visit.  Recommendations:  Based on my evaluation the patient does not appear to have an emergency medical condition.   B/P 125/84 HR 95 RM 100% TEMP 98.1 RR18  [name], NP 01/13/2017, 4:34 PM

## 2017-01-13 NOTE — Progress Notes (Signed)
Adult Psychoeducational Group Note  Date:  01/13/2017 Time:  8:31 PM  Group Topic/Focus:  Wrap-Up Group:   The focus of this group is to help patients review their daily goal of treatment and discuss progress on daily workbooks.  Participation Level:  Active  Participation Quality:  Appropriate  Affect:  Appropriate  Cognitive:  Appropriate  Insight: Appropriate  Engagement in Group:  Engaged  Modes of Intervention:  Discussion  Additional Comments:  The patient expressed she had a emotional day.  Octavio Mannshigpen, Tamkia Temples Lee 01/13/2017, 8:31 PM

## 2017-01-13 NOTE — Tx Team (Signed)
Initial Treatment Plan 01/13/2017 6:06 PM Tara Thompson [id number]    PATIENT STRESSORS: Loss of best friend died 2 years ago Marital or family conflict   PATIENT STRENGTHS: Capable of independent living Careers information officerinancial means Motivation for treatment/growth Physical Health   PATIENT IDENTIFIED PROBLEMS: Depression  Suicidal thougths    Mind straightened out again"  "Not hurt myself"             DISCHARGE CRITERIA:  Improved stabilization in mood, thinking, and/or behavior Motivation to continue treatment in a less acute level of care Verbal commitment to aftercare and medication compliance  PRELIMINARY DISCHARGE PLAN: Outpatient therapy Medication management  PATIENT/FAMILY INVOLVEMENT: This treatment plan has been presented to and reviewed with the patient, [name].  The patient and family have been given the opportunity to ask questions and make suggestions.  Levin BaconHeather V Tzvi Economou, RN 01/13/2017, 6:06 PM
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
## 2017-01-14 DIAGNOSIS — Z813 Family history of other psychoactive substance abuse and dependence: Secondary | ICD-10-CM

## 2017-01-14 DIAGNOSIS — Z818 Family history of other mental and behavioral disorders: Secondary | ICD-10-CM

## 2017-01-14 DIAGNOSIS — Z91410 Personal history of adult physical and sexual abuse: Secondary | ICD-10-CM

## 2017-01-14 DIAGNOSIS — F250 Schizoaffective disorder, bipolar type: Principal | ICD-10-CM

## 2017-01-14 DIAGNOSIS — F4310 Post-traumatic stress disorder, unspecified: Secondary | ICD-10-CM

## 2017-01-14 DIAGNOSIS — F419 Anxiety disorder, unspecified: Secondary | ICD-10-CM

## 2017-01-14 DIAGNOSIS — G4700 Insomnia, unspecified: Secondary | ICD-10-CM

## 2017-01-14 DIAGNOSIS — R450 Nervousness: Secondary | ICD-10-CM

## 2017-01-14 LAB — PROLACTIN: Prolactin: 6.2 ng/mL (ref 4.8–23.3)

## 2017-01-14 NOTE — [facility] Suicide Risk Assessment (Signed)
[facility] INPATIENT:  Family/Significant Other Suicide Prevention Education  Suicide Prevention Education:  Patient Refusal for Family/Significant Other Suicide Prevention Education: The patient Doristine LocksMadison M Wolman has refused to provide written consent for family/significant other to be provided Family/Significant Other Suicide Prevention Education during admission and/or prior to discharge.  Physician notified.  Ida RogueRodney B Blaire Palomino 01/14/2017, 1:48 PM

## 2017-01-14 NOTE — [facility] Suicide Risk Assessment (Signed)
[facility]   Nursing information obtained from:  Patient Demographic factors:  Caucasian, Living alone Current Mental Status:  Self-harm thoughts Loss Factors:  Loss of significant relationship Historical Factors:  Prior suicide attempts, Impulsivity Risk Reduction Factors:  Responsible for children under 30 years of age  Total Time spent with patient: 45 minutes Principal Problem: Schizoaffective disorder (HCC) Diagnosis:   Patient Active Problem List   Diagnosis Date Noted  . Schizoaffective disorder (HCC) [F25.9] 01/13/2017  . Bipolar disorder, current episode mixed, severe, with psychotic features (HCC) [F31.64] 05/26/2014  . Cannabis use disorder, mild, abuse [F12.10]   . Encounter for female sterilization procedure [Z30.2] 07/22/2013  . Chronic pelvic pain in female [R10.2, G89.29] 04/07/2012   Subjective Data:   Tara Thompson is a 31 y/o F with history of schizoaffective bipolar type who was admitted with worsening depression, and SI with plan to drive off a mountain. Pt had worsening of her mood symptoms in the context of argument with her mother regarding visitation of pt's daughter. Pt's daughter is cared for by pt's parents and grandparents, and pt comes to visit on the weekends. Pt had planned to visit during the week on an unannounced visit because she felt that her daughter was spending too much time on her iPad during weekend visits. After pt had arrived, her mother told pt that she was not welcome to visit until the weekend, which caused pt to have worsened mood symptoms and SI. Pt explains, "I was thinking to stab myself in the neck in a vein or to drive my car off a mountain." Pt reports that she decided to get help and then took herself to the hospital. She denies HI/AH/VH. She reports good adherence to her outpatient treatment regimen. Pt reports she had been doing well overall prior to this altercation. Her sleep had been good and she had been  eating well. She denied other symptoms of depression, mania, OCD, and PTSD. She denies illicit substance use aside from cannabis a few times per week.  Discussed with patient about treatment options. Pt would like to be restarted on her previous outpatient medications of zyprexa 20mg  qhs and gabapentin 300mg  tid. We agreed to resume those home medications and observe for symptom improvement on the inpatient unit. [facility]. She had no further questions, comments, or concerns.   Continued Clinical Symptoms:  Alcohol Use Disorder Identification Test Final Score (AUDIT): 4 The "Alcohol Use Disorders Identification Test", Guidelines for Use in Primary Care, Second Edition.  World Science writerHealth Organization [facility](WHO). Score between 0-7:  no or low risk or alcohol related problems. Score between 8-15:  moderate risk of alcohol related problems. Score between 16-19:  high risk of alcohol related problems. Score 20 or above:  warrants further diagnostic evaluation for alcohol dependence and treatment.   CLINICAL FACTORS:   Severe Anxiety and/or Agitation Bipolar Disorder:   Mixed State Alcohol/Substance Abuse/Dependencies More than one psychiatric diagnosis Previous Psychiatric Diagnoses and Treatments   Musculoskeletal: Strength & Muscle Tone: within normal limits Gait & Station: normal Patient leans: N/A  Psychiatric Specialty Exam: Physical Exam  Nursing note and vitals reviewed.   Review of Systems  Constitutional: Negative for chills and fever.  Respiratory: Negative for cough.   Cardiovascular: Negative for chest pain.  Gastrointestinal: Negative for heartburn and nausea.  Psychiatric/Behavioral: Positive for depression and substance abuse. Negative for hallucinations and suicidal ideas. The patient is nervous/anxious.  Blood pressure 99/72, pulse 93, temperature 98.1 F (36.7 C), resp. rate 18, height 5\' 1"  (1.549 m), weight 49 kg (108  lb), SpO2 99 %.Body mass index is 20.41 kg/m.  General Appearance: Casual and Fairly Groomed  Eye Contact:  Good  Speech:  Clear and Coherent and Normal Rate  Volume:  Normal  Mood:  Anxious and Depressed  Affect:  Appropriate and Congruent  Thought Process:  Coherent and Goal Directed  Orientation:  Full (Time, Place, and Person)  Thought Content:  Logical  Suicidal Thoughts:  No  Homicidal Thoughts:  No  Memory:  Immediate;   Good Recent;   Good Remote;   Good  Judgement:  Fair  Insight:  Fair  Psychomotor Activity:  Normal  Concentration:  Concentration: Good  Recall:  Good  Fund of Knowledge:  Fair  Language:  Fair  Akathisia:  No  Handed:    AIMS (if indicated):     Assets:  Communication Skills Resilience Social Support Talents/Skills  ADL's:  Intact  Cognition:  WNL  Sleep:  Number of Hours: 6.75      COGNITIVE FEATURES THAT CONTRIBUTE TO RISK:  None    SUICIDE RISK:   Minimal: No identifiable suicidal ideation.  Patients presenting with no risk factors but with morbid ruminations; may be classified as minimal risk based on the severity of the depressive symptoms  PLAN OF CARE:   - Admit to inpatient level of care  - Schizoaffective bipolar type  - Restart zyprexa 20mg  qhs  - Restart gabapentin 300mg  TID - anxiety  - Start vistaril 25mg  q8h prn anxiety -Insomnia  - Start trazodone 50mg  qhs prn insomnia  -Encourage participation in groups and therapeutic milieu -Discharge planning will be ongoing  I certify that inpatient services furnished can reasonably be expected to improve the patient's condition.   Micheal Likenshristopher T Khasir Woodrome, MD 01/14/2017, 3:20 PM
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
## 2017-01-14 NOTE — Progress Notes (Signed)
Recreation Therapy Notes  INPATIENT RECREATION THERAPY ASSESSMENT  Patient Details Name: Tara LocksMadison M Cargo MRN: [id number] DOB: [birth year] Today's Date: 01/14/2017  Patient Stressors: Family, Death  Pt stated she was here because she went to visit her grandmother and her mother told her to leave. Pt stated not seeing her daughter and her relationship with her mother are stressors. Pt stated her friend died 2 years ago.  Coping Skills:   Isolate, Avoidance, Exercise, Talking, Music, Other (Comment)(Medication; sit on the river; swing)  Personal Challenges: Expressing Yourself, Problem-Solving, Social Interaction  Leisure Interests (2+):  Individual - Other (Comment)(Take drives; watch movies)  Awareness of Community Resources:  Yes  Community Resources:  Library, Research scientist (physical sciences)Movie Theaters, Other (Comment)(Hiking trails)  Current Use: Yes  Patient Strengths:  Truthful; Try to be a good person  Patient Identified Areas of Improvement:  Self-esteem; not let others bring my down  Current Recreation Participation:  Everyday  Patient Goal for Hospitalization:  "Take a moment to breath and recalculate"  [place]  [place]idence[place]  Current SI (including self-harm):  No  Current HI:  No  Consent to Intern Participation: N/A   Caroll RancherMarjette Shelagh Rayman, LRT/CTRS  Caroll RancherLindsay, Andrina Locken A 01/14/2017, 2:23 PM
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
## 2017-01-14 NOTE — Tx Team (Signed)
Interdisciplinary Treatment and Diagnostic Plan Update  01/14/2017 Time of Session: 12:08 PM  Tara Thompson MRN: [id number]  Principal Diagnosis: <principal problem not specified>  Secondary Diagnoses: Active Problems:   Schizoaffective disorder (HCC)   Current Medications:  Current Facility-Administered Medications  Medication Dose Route Frequency Provider Last Rate Last Dose  . acetaminophen (TYLENOL) tablet 650 mg  650 mg Oral Q6H PRN Derrill Center, NP      . alum & mag hydroxide-simeth (MAALOX/MYLANTA) 200-200-20 MG/5ML suspension 30 mL  30 mL Oral Q4H PRN Derrill Center, NP      . benztropine (COGENTIN) tablet 0.5 mg  0.5 mg Oral QHS Derrill Center, NP   0.5 mg at 01/13/17 2122  . gabapentin (NEURONTIN) capsule 300 mg  300 mg Oral TID Derrill Center, NP   300 mg at 01/14/17 0816  . magnesium hydroxide (MILK OF MAGNESIA) suspension 30 mL  30 mL Oral Daily PRN Derrill Center, NP      . nicotine (NICODERM CQ - dosed in mg/24 hours) patch 21 mg  21 mg Transdermal Daily Derrill Center, NP   21 mg at 01/14/17 0815  . OLANZapine (ZYPREXA) tablet 10 mg  10 mg Oral QHS Derrill Center, NP   10 mg at 01/13/17 2122  . traZODone (DESYREL) tablet 50 mg  50 mg Oral QHS PRN Derrill Center, NP        PTA Medications: Medications Prior to Admission  Medication Sig Dispense Refill Last Dose  . gabapentin (NEURONTIN) 300 MG capsule Take 1 capsule (300 mg total) by mouth 3 (three) times daily. For agitation 90 capsule 0 Past Week at Unknown time  . OLANZapine (ZYPREXA) 10 MG tablet Take 20 mg by mouth at bedtime.   Past Week at Unknown time  . QUEtiapine (SEROQUEL) 50 MG tablet Take 3 tablets (150 mg total) by mouth at bedtime. For mood control 30 tablet 0     Patient Stressors: Loss of best friend died 2 years ago Marital or family conflict  Patient Strengths: Capable of independent living Social worker for treatment/growth Physical Health  Treatment Modalities:  Medication Management, Group therapy, Case management,  1 to 1 session with clinician, Psychoeducation, Recreational therapy.   Physician Treatment Plan for Primary Diagnosis: <principal problem not specified> Long Term Goal(s): Improvement in symptoms so as ready for discharge  Short Term Goals: Ability to identify changes in lifestyle to reduce recurrence of condition will improve Ability to disclose and discuss suicidal ideas Ability to demonstrate self-control will improve Ability to identify and develop effective coping behaviors will improve Compliance with prescribed medications will improve Ability to identify triggers associated with substance abuse/mental health issues will improve  Medication Management: Evaluate patient's response, side effects, and tolerance of medication regimen.  Therapeutic Interventions: 1 to 1 sessions, Unit Group sessions and Medication administration.  Evaluation of Outcomes: Progressing  Physician Treatment Plan for Secondary Diagnosis: Active Problems:   Schizoaffective disorder ([place])   Long Term Goal(s): Improvement in symptoms so as ready for discharge  Short Term Goals: Ability to identify changes in lifestyle to reduce recurrence of condition will improve Ability to disclose and discuss suicidal ideas Ability to demonstrate self-control will improve Ability to identify and develop effective coping behaviors will improve Compliance with prescribed medications will improve Ability to identify triggers associated with substance abuse/mental health issues will improve  Medication Management: Evaluate patient's response, side effects, and tolerance of medication regimen.  Therapeutic Interventions: 1 to  1 sessions, Unit Group sessions and Medication administration.  Evaluation of Outcomes: Progressing   RN Treatment Plan for Primary Diagnosis: <principal problem not specified> Long Term Goal(s): Knowledge of disease and therapeutic  regimen to maintain health will improve  Short Term Goals: Ability to disclose and discuss suicidal ideas and Ability to identify and develop effective coping behaviors will improve  Medication Management: RN will administer medications as ordered by provider, will assess and evaluate patient's response and provide education to patient for prescribed medication. RN will report any adverse and/or side effects to prescribing provider.  Therapeutic Interventions: 1 on 1 counseling sessions, Psychoeducation, Medication administration, Evaluate responses to treatment, Monitor vital signs and CBGs as ordered, Perform/monitor CIWA, COWS, AIMS and Fall Risk screenings as ordered, Perform wound care treatments as ordered.  Evaluation of Outcomes: Progressing   LCSW Treatment Plan for Primary Diagnosis: <principal problem not specified> Long Term Goal(s): Safe transition to appropriate next level of care at discharge, Engage patient in therapeutic group addressing interpersonal concerns.  Short Term Goals: Engage patient in aftercare planning with referrals and resources  Therapeutic Interventions: Assess for all discharge needs, 1 to 1 time with Social worker, Explore available resources and support systems, Assess for adequacy in community support network, Educate family and significant other(s) on suicide prevention, Complete Psychosocial Assessment, Interpersonal group therapy.  Evaluation of Outcomes: Met  Return home, follow up [facility] in [place] in Treatment: Attending groups: Yes Participating in groups: Yes Taking medication as prescribed: Yes Toleration medication: Yes, no side effects reported at this time Family/Significant other contact made: No Patient understands diagnosis: Yes AEB asking for help because "I am in crises" Discussing patient identified problems/goals with staff: Yes Medical problems stabilized or resolved: Yes Denies suicidal/homicidal ideation:  Yes Issues/concerns per patient self-inventory: None Other: N/A  New problem(s) identified: None identified at this time.   New Short Term/Long Term Goal(s): "Not hurt myself"   Discharge Plan or Barriers:   Reason for Continuation of Hospitalization: Anxiety Depression Medication stabilization Suicidal ideation   Estimated Length of Stay: 12/10  Attendees: Patient: Tara Thompson 01/14/2017  12:08 PM  Physician: Maris Berger, MD 01/14/2017  12:08 PM  Nursing: Sena Hitch, RN 01/14/2017  12:08 PM  RN Care Manager: Lars Pinks, RN 01/14/2017  12:08 PM  Social Worker: Ripley Fraise 01/14/2017  12:08 PM  Recreational Therapist: Winfield Cunas 01/14/2017  12:08 PM  Other: Norberto Sorenson 01/14/2017  12:08 PM  Other:  01/14/2017  12:08 PM    Scribe for Treatment Team:  Roque Lias LCSW 01/14/2017 12:08 PM

## 2017-01-14 NOTE — Progress Notes (Signed)
D:  Patient's self inventory sheet, patient sleeps good, sleep medication helpful.  Good appetite, normal energy level, good concentration.  Rated depression 4, hopeless 7, anxiety 3.  Denied withdrawals.  Denied physical problems.  Denied physical pain.  Goal is try not to cry and miss my best friend.  Not sure of plan.  No discharge plans. A:  Medications administered per MD orders.  Emotional support and encouragement given patient. R:  Denied SI and HI, contracts for safety.  Denied A/V hallucinations.  Safety maintained with 15 minute checks.

## 2017-01-14 NOTE — Plan of Care (Signed)
Nurse discussed depression, anxiety, coping skills with patient.  

## 2017-01-14 NOTE — Progress Notes (Signed)
Recreation Therapy Notes  Date: 01/14/17 Time: 1000 Location: 500 Hall Dayroom  Group Topic: Anger Management  Goal Area(s) Addresses:  Patient will identify triggers for anger.  Patient will identify physical reaction to anger.   Patient will identify benefit of using coping skills when angry.  Behavioral Response: Engaged  Intervention: Worksheet  Activity: The [employer] Emotion.  Patients were given a worksheet with an empty umbrella.  Patients were to identify and write in the umbrella the emotions and situations that can cause anger.  Patients were to then write at least 5 coping skills on the outside of the umbrella they could use to deal with their anger.  Education: Anger Management, Discharge Planning   Education Outcome: Acknowledges education/In group clarification offered/Needs additional education.   Clinical Observations/Feedback:  Pt stated her anger is caused by "dumbasses".  Pt also explained when she gets angry she breaks things, punches walls and twitches.  Pt was very fixated on the police and her mother.  Pt talked about how her mother took her daughter and was wishing all kinds of bad things happen to her mother.  Pt needed constant redirection. Pt stated some of her coping skills were hiking, music and attending group therapy.   Caroll RancherMarjette Mishal Probert, LRT/CTRS       Caroll RancherLindsay, Jimy Gates A 01/14/2017 12:25 PM

## 2017-01-15 LAB — HEMOGLOBIN A1C
HEMOGLOBIN A1C: 5.1 % (ref 4.8–5.6)
MEAN PLASMA GLUCOSE: 99.67 mg/dL

## 2017-01-15 LAB — LIPID PANEL
Cholesterol: 155 mg/dL (ref 0–200)
HDL: 44 mg/dL (ref >40)
LDL Cholesterol: 92 mg/dL (ref 0–99)
TRIGLYCERIDES: 94 mg/dL (ref <150)
Total CHOL/HDL Ratio: 3.5 RATIO
VLDL: 19 mg/dL (ref 0–40)

## 2017-01-15 LAB — TSH: TSH: 1.39 u[IU]/mL (ref 0.350–4.500)

## 2017-01-15 MED ORDER — HYDROXYZINE HCL 25 MG PO TABS: 25.0000 mg | ORAL_TABLET | Freq: Four times a day (QID) | ORAL | Status: DC | PRN

## 2017-01-15 MED ORDER — OLANZAPINE 10 MG PO TABS
20.0000 mg | ORAL_TABLET | Freq: Every day | ORAL | Status: DC
  Administered 2017-01-15: 20 mg via ORAL
  Filled 2017-01-15 (×3): qty 2

## 2017-01-15 NOTE — Progress Notes (Signed)
D: Pt presents animated but endorsed being anxious. Denies SI, HI, AVH and pain when assessed. Compliant with medications when offered. Denies adverse drug reactions when assessed. Visible in milieu at intervals during shift. Attended unit groups as scheduled. Pt expressed her excitement related to d/c "I can't wait to get home, I'm tired being in here".  A: All medications given as per MD's orders. Compliance with treatment regimen encouraged. Emotional support offered. Q 15 minutes safety checks continues without self harm gestures at this time.  R: Pt receptive to care. Cooperative with unit routines. Remains safe on and off unit

## 2017-01-15 NOTE — Progress Notes (Signed)
Nursing Progress Note [id number]  D) Patient is resting in bed in no acute distress.   A) Labs, vital signs and patient behavior monitored throughout shift. Patient safety maintained with q15 min safety checks. Low fall risk precautions in place and reviewed with patient; patient verbalized understanding.  R) Patient remains safe on the unit at this time.  Patient is resting in bed without complaints. Will continue to monitor.

## 2017-01-15 NOTE — Progress Notes (Signed)
D:  Tara Thompson has been up and visible on the unit.  She continues to report depression and anxiety.  She denies SI/HI or A/V hallucinations.  She does state that she is sad because of the family and how her mother treats her and that she is missing her daughter.  She has been attending groups and interacting well with some peers.   A:  1:1 interaction with RN for support and encouragement.  Medications as ordered.  Q 15 minute checks maintained for safety.  Encouraged participation in group and unit activities.   R:  Tara Thompson remains safe on the unit.  We will continue to monitor the progress towards her goals.

## 2017-01-15 NOTE — Progress Notes (Signed)
Recreation Therapy Notes  Date: 01/15/17 Time: 1000 Location: 500 Hall Dayroom  Group Topic: Self-Esteem  Goal Area(s) Addresses:  Patient will successfully identify positive attributes about themselves.  Patient will successfully identify benefit of improved self-esteem.   Behavioral Response: Engaged  Intervention: Visual merchandiser, markers  Activity: Naval architect.  Patients were to create an advertisement that describes and highlights the positive traits and qualities about themselves.  Education:  Self-Esteem, Dentist.   Education Outcome: Acknowledges education/In group clarification offered/Needs additional education  Clinical Observations/Feedback: Pt was still waking up.  Pt stated her sleep medication made her "sleep like a rock".  Pt advertised herself as non-judgmental, humorous, outgoing, [employer] basketball fan and loves [employer] makeup.  Pt was able to focus on group.  Pt wasn't focused on her mother or the police like in previous group.    Victorino Sparrow, LRT/CTRS        Ria Comment, Xana Bradt A 01/15/2017 11:30 AM

## 2017-01-15 NOTE — Progress Notes (Signed)
Adult Psychoeducational Group Note  Date:  01/15/2017 Time:  8:41 PM  Group Topic/Focus:  Wrap-Up Group:   The focus of this group is to help patients review their daily goal of treatment and discuss progress on daily workbooks.  Participation Level:  Active  Participation Quality:  Appropriate  Affect:  Appropriate  Cognitive:  Appropriate  Insight: Appropriate  Engagement in Group:  Engaged  Modes of Intervention:  Discussion  Additional Comments:  The [patient expressed that she attended groups.the patient also said that she rates today a 6.  Octavio Mannshigpen, Sehaj Mcenroe Lee 01/15/2017, 8:41 PM

## 2017-01-15 NOTE — Progress Notes (Signed)
[facility] MD Progress Note  01/15/2017 10:57 AM Tara Thompson  MRN:  [id number] Subjective:   Tara Thompson is a 31 y/o F with history of schizoaffective bipolar type who was admitted with worsening depression, and SI with plan to drive off a mountain. She was restarted on home medications of zyprexa and gabapentin. Today upon evaluation, pt reports she is doing better in terms of her mood but she feels drowsy this AM. She explains, "I feel like I'm hung over but I didn't drink - it might have been the trazodone." She denies SI/HI/AH/VH. She slept well after taking trazodone. Her appetite is good. We discussed discontinuing trazodone and instead utilizing atarax at bedtime if she needs help with initial insomnia, and pt was in agreement with this plan. Pt would like to continue her other medications without changes. She is anticipating discharge to outpatient level of care tomorrow if she continues to have consistent improvement of her mood symptoms.   Principal Problem: Schizoaffective disorder ([place]) Diagnosis:   Patient Active Problem List   Diagnosis Date Noted  . Schizoaffective disorder ([place]) [F25.9] 01/13/2017  . Bipolar disorder, current episode mixed, severe, with psychotic features ([place]) [F31.64] 05/26/2014  . Cannabis use disorder, mild, abuse [F12.10]   . Encounter for female sterilization procedure [Z30.2] 07/22/2013  . Chronic pelvic pain in female [R10.2, G89.29] 04/07/2012   Total Time spent with patient: 30 minutes  Past Psychiatric History: see H&P  Past Medical History:  Past Medical History:  Diagnosis Date  . Abnormal Pap smear    LSIL>normal follow-up pap  . Anxiety   . Bipolar disorder ([place])   . Depression   . Mental disorder   . Ovarian cyst     Past Surgical History:  Procedure Laterality Date  . COLPOSCOPY    . LAPAROSCOPIC TUBAL LIGATION Bilateral 07/22/2013   Procedure: LAPAROSCOPIC TUBAL LIGATION;  Surgeon: Lahoma Crocker, MD;  Location: [facility];   Service: Gynecology;  Laterality: Bilateral;  . WISDOM TOOTH EXTRACTION  age 31   Family History:  Family History  Problem Relation Age of Onset  . Bipolar disorder Maternal Grandmother   . Cancer Paternal Grandmother   . Depression Maternal Uncle   . Depression Paternal Aunt   . Drug abuse Brother    Family Psychiatric  History: see H&P Social History:  Social History   Substance and Sexual Activity  Alcohol Use No  . Alcohol/week: 1.2 oz  . Types: 1 Glasses of wine, 1 Cans of beer per week   Comment: occasional - one glass of wine per month     Social History   Substance and Sexual Activity  Drug Use No   Comment: Reports she quit THC early July 2014, used cocaine once in July of 2013    Social History   Socioeconomic History  . Marital status: Single    Spouse name: None  . Number of children: None  . Years of education: None  . Highest education level: None  Social Needs  . Financial resource strain: None  . Food insecurity - worry: None  . Food insecurity - inability: None  . Transportation needs - medical: None  . Transportation needs - non-medical: None  Occupational History  . None  Tobacco Use  . Smoking status: Current Every Day Smoker    Packs/day: 0.50    Years: 11.00    Pack years: 5.50    Types: Cigarettes  . Smokeless tobacco: Never Used  Substance and Sexual Activity  .  Alcohol use: No    Alcohol/week: 1.2 oz    Types: 1 Glasses of wine, 1 Cans of beer per week    Comment: occasional - one glass of wine per month  . Drug use: No    Comment: Reports she quit THC early July 2014, used cocaine once in July of 2013  . Sexual activity: Yes    Birth control/protection: Condom, Surgical    Comment: Tubal Ligation   Other Topics Concern  . None  Social History Narrative   08/25/2012 AHW  Tara Thompson was born and grew up in [place], [place]. She has a younger brother. Her parents divorced when she was 20 years old. She reports that her  childhood was "awful." She reports that she witnessed her parents have frequent verbal confrontations. She graduated from high school. 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 She has never been married. She has a 19-year-old daughter. She currently lives with her mother and her daughter. Her hobbies include reading and painting. She affiliates as a Engineer, manufacturing. She denies any legal difficulties. She reports that her social support system consists of her mother and father. 08/25/2012 AHW    Additional Social History:    Pain Medications: denies Prescriptions: Zyprexa, Gabapentin Over the Counter: denies History of alcohol / drug use?: Yes Name of Substance 1: Marijuana 1 - Frequency: "not much" 1 - Duration: ongoing                  Sleep: Good  Appetite:  Good  Current Medications: Current Facility-Administered Medications  Medication Dose Route Frequency Provider Last Rate Last Dose  . acetaminophen (TYLENOL) tablet 650 mg  650 mg Oral Q6H PRN Derrill Center, NP      . alum & mag hydroxide-simeth (MAALOX/MYLANTA) 200-200-20 MG/5ML suspension 30 mL  30 mL Oral Q4H PRN Derrill Center, NP      . benztropine (COGENTIN) tablet 0.5 mg  0.5 mg Oral QHS Derrill Center, NP   0.5 mg at 01/14/17 2143  . gabapentin (NEURONTIN) capsule 300 mg  300 mg Oral TID Derrill Center, NP   300 mg at 01/15/17 2694  . hydrOXYzine (ATARAX/VISTARIL) tablet 25 mg  25 mg Oral Q6H PRN Pennelope Bracken, MD      . magnesium hydroxide (MILK OF MAGNESIA) suspension 30 mL  30 mL Oral Daily PRN Derrill Center, NP      . nicotine (NICODERM CQ - dosed in mg/24 hours) patch 21 mg  21 mg Transdermal Daily Derrill Center, NP   21 mg at 01/15/17 8546  . OLANZapine (ZYPREXA) tablet 20 mg  20 mg Oral QHS Pennelope Bracken, MD        Lab Results:  Results for orders placed or performed during the hospital encounter of 01/13/17 (from  the past 48 hour(s))  CBC     Status: Abnormal   Collection Time: 01/13/17  6:25 PM  Result Value Ref Range   WBC 9.2 4.0 - 10.5 K/uL   RBC 5.00 3.87 - 5.11 MIL/uL   Hemoglobin 14.9 12.0 - 15.0 g/dL   HCT 43.9 36.0 - 46.0 %   MCV 87.8 78.0 - 100.0 fL   MCH 29.8 26.0 - 34.0 pg   MCHC 33.9 30.0 - 36.0 g/dL   RDW 12.5 11.5 - 15.5 %   Platelets 525 (H) 150 - 400 K/uL    Comment: Performed at [facility], [place] [address] [place], [place] [address]  Comprehensive metabolic panel     Status: Abnormal   Collection Time: 01/13/17  6:25 PM  Result Value Ref Range   Sodium 133 (L) 135 - 145 mmol/L   Potassium 3.8 3.5 - 5.1 mmol/L   Chloride 95 (L) 101 - 111 mmol/L   CO2 28 22 - 32 mmol/L   Glucose, Bld 111 (H) 65 - 99 mg/dL   BUN 17 6 - 20 mg/dL   Creatinine, Ser 0.63 0.44 - 1.00 mg/dL   Calcium 9.2 8.9 - 10.3 mg/dL   Total Protein 8.3 (H) 6.5 - 8.1 g/dL   Albumin 4.7 3.5 - 5.0 g/dL   AST 28 15 - 41 U/L   ALT 15 14 - 54 U/L   Alkaline Phosphatase 90 38 - 126 U/L   Total Bilirubin 1.4 (H) 0.3 - 1.2 mg/dL   GFR calc non Af Amer >60 >60 mL/min   GFR calc Af Amer >60 >60 mL/min    Comment: (NOTE) The eGFR has been calculated using the CKD EPI equation. This calculation has not been validated in all clinical situations. eGFR's persistently <60 mL/min signify possible Chronic Kidney Disease.    Anion gap 10 5 - 15    Comment: Performed at [facility], [place] [address], [place], [place] [address]  Hemoglobin A1c     Status: None   Collection Time: 01/13/17  6:25 PM  Result Value Ref Range   Hgb A1c MFr Bld 5.2 4.8 - 5.6 %    Comment: (NOTE) Pre diabetes:          5.7%-6.4% Diabetes:              >6.4% Glycemic control for   <7.0% adults with diabetes    Mean Plasma Glucose 102.54 mg/dL    Comment: Performed at [facility] [address] [place], [place] [address]  Lipid panel     Status: Abnormal   Collection Time: 01/13/17  6:25 PM   Result Value Ref Range   Cholesterol 199 0 - 200 mg/dL   Triglycerides 48 <150 mg/dL   HDL 49 >40 mg/dL   Total CHOL/HDL Ratio 4.1 RATIO   VLDL 10 0 - 40 mg/dL   LDL Cholesterol 140 (H) 0 - 99 mg/dL    Comment:        Total Cholesterol/HDL:CHD Risk Coronary Heart Disease Risk Table                     Men   Women  1/2 Average Risk   3.4   3.3  Average Risk       5.0   4.4  2 X Average Risk   9.6   7.1  3 X Average Risk  23.4   11.0        Use the calculated Patient Ratio above and the CHD Risk Table to determine the patient's CHD Risk.        ATP III CLASSIFICATION (LDL):  <100     mg/dL   Optimal  100-129  mg/dL   Near or Above                    Optimal  130-159  mg/dL   Borderline  160-189  mg/dL   High  >190     mg/dL   Very High Performed at [facility] [address], [place],  [address]  TSH     Status: None   Collection Time: 01/13/17  6:25 PM  Result Value Ref Range   TSH 0.500 0.350 - 4.500 uIU/mL    Comment: Performed by a 3rd Generation assay with a functional sensitivity of <=0.01 uIU/mL. Performed at [facility], [place] [address] [place], [place] [address]   Prolactin     Status: None   Collection Time: 01/13/17  6:25 PM  Result Value Ref Range   Prolactin 6.2 4.8 - 23.3 ng/mL    Comment: (NOTE) Performed At: [facility] [place], [place] [id number] Rush Farmer MD [id number] Performed at [facility], [place] [address], [place], [name] [address]   TSH     Status: None   Collection Time: 01/15/17  6:34 AM  Result Value Ref Range   TSH 1.390 0.350 - 4.500 uIU/mL    Comment: Performed by a 3rd Generation assay with a functional sensitivity of <=0.01 uIU/mL. Performed at [facility], [place] [address], [place], [place] [address]   Hemoglobin A1c     Status: None   Collection Time: 01/15/17  6:34 AM  Result Value Ref Range   Hgb A1c MFr Bld 5.1 4.8 -  5.6 %    Comment: (NOTE) Pre diabetes:          5.7%-6.4% Diabetes:              >6.4% Glycemic control for   <7.0% adults with diabetes    Mean Plasma Glucose 99.67 mg/dL    Comment: Performed at [facility] [address], [place], [place] [address]    Blood Alcohol level:  Lab Results  Component Value Date   [facility] <5 05/24/2014   ETH <5 80/22/3361    Metabolic Disorder Labs: Lab Results  Component Value Date   HGBA1C 5.1 01/15/2017   MPG 99.67 01/15/2017   MPG 102.54 01/13/2017   Lab Results  Component Value Date   PROLACTIN 6.2 01/13/2017   Lab Results  Component Value Date   CHOL 199 01/13/2017   TRIG 48 01/13/2017   HDL 49 01/13/2017   CHOLHDL 4.1 01/13/2017   VLDL 10 01/13/2017   LDLCALC 140 (H) 01/13/2017   LDLCALC 83 03/04/2014    Physical Findings: AIMS: Facial and Oral Movements Muscles of Facial Expression: None, normal Lips and Perioral Area: None, normal Jaw: None, normal Tongue: None, normal,Extremity Movements Upper (arms, wrists, hands, fingers): None, normal Lower (legs, knees, ankles, toes): None, normal, Trunk Movements Neck, shoulders, hips: None, normal, Overall Severity Severity of abnormal movements (highest score from questions above): None, normal Incapacitation due to abnormal movements: None, normal Patient's awareness of abnormal movements (rate only patient's report): No Awareness, Dental Status Current problems with teeth and/or dentures?: No Does patient usually wear dentures?: No  CIWA:  CIWA-Ar Total: 1 COWS:  COWS Total Score: 2  Musculoskeletal: Strength & Muscle Tone: within normal limits Gait & Station: normal Patient leans: N/A  Psychiatric Specialty Exam: Physical Exam  Nursing note and vitals reviewed.   Review of Systems  Constitutional: Negative for chills and fever.  Respiratory: Negative for cough.   Gastrointestinal: Negative for heartburn and nausea.  Psychiatric/Behavioral: Negative for  depression, hallucinations and suicidal ideas. The patient is not nervous/anxious.     Blood pressure 119/71, pulse (!) 102, temperature 98.7 F (37.1 C), temperature source Oral, resp. rate 16, height _0  (1.549 m), weight 49 kg (108 lb), SpO2 99 %.Body mass  index is 20.41 kg/m.  General Appearance: Casual and Fairly Groomed  Eye Contact:  Good  Speech:  Clear and Coherent and Normal Rate  Volume:  Normal  Mood:  Anxious and Euthymic  Affect:  Congruent and Full Range  Thought Process:  Coherent and Goal Directed  Orientation:  Full (Time, Place, and Person)  Thought Content:  Logical  Suicidal Thoughts:  No  Homicidal Thoughts:  No  Memory:  Immediate;   Good Recent;   Good Remote;   Good  Judgement:  Good  Insight:  Good  Psychomotor Activity:  Normal  Concentration:  Concentration: Good  Recall:  Good  Fund of Knowledge:  Good  Language:  Good  Akathisia:  No  Handed:    AIMS (if indicated):     Assets:  Armed forces logistics/support/administrative officer Physical Health Resilience Social Support  ADL's:  Intact  Cognition:  WNL  Sleep:  Number of Hours: 6.5     Treatment Plan Summary: Daily contact with patient to assess and evaluate symptoms and progress in treatment and Medication management. Pt reports improvement of her mood symptoms with hospitalization, restarting her medications, and participation in the therapeutic milieu/groups. She feels groggy this AM which she associates with trazodone, and she will attempt to use atarax as needed for insomnia tonight. She is anticipating discharge to outpatient level of care tomorrow if she continues to have stability of her mood symptoms.  - Continue inpatient hospitalization  - Schizoaffective bipolar type             - Continue zyprexa 52m qhs             - Continue gabapentin 3063mTID - anxiety             - Continue vistaril 2581m8h prn anxiety/insomnia -Insomnia             - Discontinue trazodone 36m13ms prn insomnia  -Encourage  participation in groups and therapeutic milieu -Discharge planning will be ongoing    ChriPennelope Bracken 01/15/2017, 10:57 AM

## 2017-01-16 LAB — PROLACTIN: PROLACTIN: 66.7 ng/mL — AB (ref 4.8–23.3)

## 2017-01-16 MED ORDER — GABAPENTIN 300 MG PO CAPS: 300.0000 mg | ORAL_CAPSULE | Freq: Three times a day (TID) | ORAL | 0 refills | Status: DC

## 2017-01-16 MED ORDER — NICOTINE 21 MG/24HR TD PT24: 21.0000 mg | MEDICATED_PATCH | Freq: Every day | TRANSDERMAL | 0 refills | Status: DC

## 2017-01-16 MED ORDER — OLANZAPINE 20 MG PO TABS: 20.0000 mg | ORAL_TABLET | Freq: Every day | ORAL | 0 refills | Status: DC

## 2017-01-16 NOTE — Progress Notes (Signed)
Patient ID: Tara Thompson, female   DOB: [birth year], 31 y.o.   MRN: [id number]  DAR Note: Pt observed in dayroom interacting with peers. Pt denies any form of depression, anxiety, pain, SI, HI or AVH; states, "I feel good." Support, encouragement, and safe environment provided.  15-minute safety checks continue. All Pt's questions and concerns addressed. Pt was med compliant. Pt did attend wrap-up group.

## 2017-01-16 NOTE — Discharge Summary (Signed)
Physician Discharge Summary Note  Patient:  Tara Thompson is an 31 y.o., female MRN:  [id number] DOB:  12/04/1985 Patient phone:  [phone] (home)  Patient address:   [place] [address],  Total Time spent with patient: Greater than 30 minutes  Date of Admission:  01/13/2017  Date of Discharge: 01/16/17  Reason for Admission:  Worsening symptoms of depression triggering suicidal ideations with plan.  Principal Problem: Schizoaffective disorder [facility])  Discharge Diagnoses: Patient Active Problem List   Diagnosis Date Noted  . Bipolar disorder, current episode mixed, severe, with psychotic features ([place]) [F31.64] 05/26/2014    Priority: High  . Schizoaffective disorder ([place]) [F25.9] 01/13/2017  . Cannabis use disorder, mild, abuse [F12.10]   . Encounter for female sterilization procedure [Z30.2] 07/22/2013  . Chronic pelvic pain in female [R10.2, G89.29] 04/07/2012   Musculoskeletal: Strength & Muscle Tone: within normal limits Gait & Station: normal Patient leans: N/A  Psychiatric Specialty Exam: Physical Exam  Constitutional: She appears well-developed.  HENT:  Head: Normocephalic.  Eyes: Pupils are equal, round, and reactive to light.  Neck: Normal range of motion.  Cardiovascular: Normal rate.  Respiratory: Effort normal.  GI: Soft.  Genitourinary:  Genitourinary Comments: Deferred  Musculoskeletal: Normal range of motion.  Neurological: She is alert.  Skin: Skin is warm.  Psychiatric: Her speech is normal and behavior is normal. Judgment and thought content normal. Her mood appears not anxious. Her affect is not angry, not blunt, not labile and not inappropriate. Cognition and memory are normal. She does not exhibit a depressed mood.    Review of Systems  Constitutional: Negative.   HENT: Negative.   Eyes: Negative.   Respiratory: Negative.   Cardiovascular: Negative.   Gastrointestinal: Negative.   Genitourinary: Negative.    Musculoskeletal: Negative.   Skin: Negative.   Neurological: Negative.   Endo/Heme/Allergies: Negative.   Psychiatric/Behavioral: Positive for depression (Stable) and substance abuse (Hx, Cannabis abuse). Negative for hallucinations, memory loss and suicidal ideas. The patient has insomnia (Stable). The patient is not nervous/anxious.     Blood pressure (!) 94/47, pulse 85, temperature 98.2 F (36.8 C), temperature source Oral, resp. rate 16, height 5' 1"  (1.549 m), weight 49 kg (108 lb), SpO2 99 %.Body mass index is 20.41 kg/m.  See Md's SRA   Past Medical History:  Past Medical History:  Diagnosis Date  . Abnormal Pap smear    LSIL>normal follow-up pap  . Anxiety   . Bipolar disorder ([place])   . Depression   . Mental disorder   . Ovarian cyst     Past Surgical History:  Procedure Laterality Date  . COLPOSCOPY    . LAPAROSCOPIC TUBAL LIGATION Bilateral 07/22/2013   Procedure: LAPAROSCOPIC TUBAL LIGATION;  Surgeon: Tara Crocker, MD;  Location: [facility];  Service: Gynecology;  Laterality: Bilateral;  . WISDOM TOOTH EXTRACTION  age 81   Family History:  Family History  Problem Relation Age of Onset  . Bipolar disorder Maternal Grandmother   . Cancer Paternal Grandmother   . Depression Maternal Uncle   . Depression Paternal Aunt   . Drug abuse Brother    Social History:  Social History   Substance and Sexual Activity  Alcohol Use No  . Alcohol/week: 1.2 oz  . Types: 1 Glasses of wine, 1 Cans of beer per week   Comment: occasional - one glass of wine per month     Social History   Substance and Sexual Activity  Drug Use No  Comment: Reports she quit THC early July 2014, used cocaine once in July of 2013    Social History   Socioeconomic History  . Marital status: Single    Spouse name: None  . Number of children: None  . Years of education: None  . Highest education level: None  Social Needs  . Financial resource strain: None  . Food insecurity - worry:  None  . Food insecurity - inability: None  . Transportation needs - medical: None  . Transportation needs - non-medical: None  Occupational History  . None  Tobacco Use  . Smoking status: Current Every Day Smoker    Packs/day: 0.50    Years: 11.00    Pack years: 5.50    Types: Cigarettes  . Smokeless tobacco: Never Used  Substance and Sexual Activity  . Alcohol use: No    Alcohol/week: 1.2 oz    Types: 1 Glasses of wine, 1 Cans of beer per week    Comment: occasional - one glass of wine per month  . Drug use: No    Comment: Reports she quit THC early July 2014, used cocaine once in July of 2013  . Sexual activity: Yes    Birth control/protection: Condom, Surgical    Comment: Tubal Ligation   Other Topics Concern  . None  Social History Narrative   08/25/2012 AHW  Cam was born and grew up in [place], [place]. She has a younger brother. Her parents divorced when she was 89 years old. She reports that her childhood was "awful." She reports that she witnessed her parents have frequent verbal confrontations. She graduated from high school. 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 She has never been married. She has a 67-year-old daughter. She currently lives with her mother and her daughter. Her hobbies include reading and painting. She affiliates as a Engineer, manufacturing. She denies any legal difficulties. She reports that her social support system consists of her mother and father. 08/25/2012 AHW    Risk to Self: Suicidal Ideation: Yes-Currently Present Suicidal Intent: Yes-Currently Present Is patient at risk for suicide?: Yes Suicidal Plan?: Yes-Currently Present Specify Current Suicidal Plan: drive car off the mountain Access to Means: Yes Specify Access to Suicidal Means: car Triggers for Past Attempts: Unknown Intentional Self Injurious Behavior: None  Risk to Others: No  Prior Inpatient Therapy:  No  Prior Outpatient Therapy: No  Level of Care:  OP  Hospital Course:  Tara Thompson is a 31 y/o F with history of schizoaffective bipolar type who was admitted with worsening depression, and SI with plan to drive off a mountain. Pt had worsening of her mood symptoms in the context of argument with her mother regarding visitation of pt's daughter. Pt's daughter is cared for by pt's parents and grandparents, and pt comes to visit on the weekends. Pt had planned to visit during the week on an unannounced visit because she felt that her daughter was spending too much time on her iPad during weekend visits. After pt had arrived, her mother told pt that she was not welcome to visit until the weekend, which caused pt to have worsened mood symptoms and SI. Pt explains, "I was thinking to stab myself in the neck in a vein or to drive my car off a mountain." Pt reports that she decided to get help and then took herself to the hospital. She denies HI/AH/VH. She reports good adherence to her outpatient treatment  regimen. Pt reports she had been doing well overall prior to this altercation. Her sleep had been good and she had been eating well. She denied other symptoms of depression, mania, OCD, and PTSD. She denies illicit substance use aside from cannabis a few   This is one of several discharge summaries from this hospital alone for [facility] who has been a patient in this hospital for mood stabilization treatments x multiple times. She came to the [facility] hospital for re-admission & to receive treatment for worsening symptoms of her mental illness triggering suicidal ideations with plans to run her car of a mountain. She cited as her stressor not being allowed by her mother to see her 22 year old daughter who is being cared for by her mother & grandmother. Jaelani reported that for some reason, she was made aware that her visit will stress everyone in her family out & her grand-mother is afraid of her [facility]).     After her admission assessment/evaluated, Marwa's presenting symptoms were identified. The medication regimen was discussed and initiated targeting those presenting symptoms. She was oriented to the unit & encouraged to participate in the unit programming.She learned coping skills.Gyanna was medicated & discharge on; Gabapentin 300 mg for agitation, Nicotine patch 21 mg for smoking cessation & Zyprexa 20 mg for mood control. She presented no other significant medical issues that required treatment. She tolerated her treatment regimen without any adverse effects or reactions reported.     During the course of her hospital stay, Grayson was evaluated daily by a clinical provider to assure her response to her treatment regimen. The medication changes & adjustments were made according to her need. As the day goes by, improvement was noted as evidenced by her report of decreasing symptoms, improved sleep, presentation of good affect, medication tolerance & participation in the unit programming.She was required on daily basis to complete a self inventory asssessment noting mood, mental status, pain, any new symptoms, anxiety and concerns. Her symptoms responded well to her treatment regimen. Also, being in a therapeutic and supportive environment also assisted in her mood stability.   On this day of her discharge, Keishla was in much improved condition than upon admission. Her symptoms were reported as significantly decreased or resolved completely. Upon discharge, she denies SI/HI and voiced no AVH. She was motivated to continue taking medication with a goal of continued improvement in mental health. Aribella is discharged to follow-up care for medication managment & routine psychiatric care on an outpatient basis as noted below. She was provided with all the necessary information needed to make this appointment without problems.  She left [facility] in no apparent distress. Transportation per  self.  Discharge Vitals:   Blood pressure (!) 94/47, pulse 85, temperature 98.2 F (36.8 C), temperature source Oral, resp. rate 16, height 5' 1"  (1.549 m), weight 49 kg (108 lb), SpO2 99 %. Body mass index is 20.41 kg/m.  Lab Results:   Results for orders placed or performed during the hospital encounter of 01/13/17 (from the past 72 hour(s))  CBC     Status: Abnormal   Collection Time: 01/13/17  6:25 PM  Result Value Ref Range   WBC 9.2 4.0 - 10.5 K/uL   RBC 5.00 3.87 - 5.11 MIL/uL   Hemoglobin 14.9 12.0 - 15.0 g/dL   HCT 43.9 36.0 - 46.0 %   MCV 87.8 78.0 - 100.0 fL   MCH 29.8 26.0 - 34.0 pg   MCHC 33.9 30.0 - 36.0 g/dL  RDW 12.5 11.5 - 15.5 %   Platelets 525 (H) 150 - 400 K/uL    Comment: Performed at [facility], [place] [address] [place], [place] [address]  Comprehensive metabolic panel     Status: Abnormal   Collection Time: 01/13/17  6:25 PM  Result Value Ref Range   Sodium 133 (L) 135 - 145 mmol/L   Potassium 3.8 3.5 - 5.1 mmol/L   Chloride 95 (L) 101 - 111 mmol/L   CO2 28 22 - 32 mmol/L   Glucose, Bld 111 (H) 65 - 99 mg/dL   BUN 17 6 - 20 mg/dL   Creatinine, Ser 0.63 0.44 - 1.00 mg/dL   Calcium 9.2 8.9 - 10.3 mg/dL   Total Protein 8.3 (H) 6.5 - 8.1 g/dL   Albumin 4.7 3.5 - 5.0 g/dL   AST 28 15 - 41 U/L   ALT 15 14 - 54 U/L   Alkaline Phosphatase 90 38 - 126 U/L   Total Bilirubin 1.4 (H) 0.3 - 1.2 mg/dL   GFR calc non Af Amer >60 >60 mL/min   GFR calc Af Amer >60 >60 mL/min    Comment: (NOTE) The eGFR has been calculated using the CKD EPI equation. This calculation has not been validated in all clinical situations. eGFR's persistently <60 mL/min signify possible Chronic Kidney Disease.    Anion gap 10 5 - 15    Comment: Performed at [facility], [place] [address] [place], [place] [address]  Hemoglobin A1c     Status: None   Collection Time: 01/13/17  6:25 PM  Result Value Ref Range   Hgb A1c MFr Bld 5.2 4.8 - 5.6 %     Comment: (NOTE) Pre diabetes:          5.7%-6.4% Diabetes:              >6.4% Glycemic control for   <7.0% adults with diabetes    Mean Plasma Glucose 102.54 mg/dL    Comment: Performed at [place] [address], [place], [place] [address]  Lipid panel     Status: Abnormal   Collection Time: 01/13/17  6:25 PM  Result Value Ref Range   Cholesterol 199 0 - 200 mg/dL   Triglycerides 48 <150 mg/dL   HDL 49 >40 mg/dL   Total CHOL/HDL Ratio 4.1 RATIO   VLDL 10 0 - 40 mg/dL   LDL Cholesterol 140 (H) 0 - 99 mg/dL    Comment:        Total Cholesterol/HDL:CHD Risk Coronary Heart Disease Risk Table                     Men   Women  1/2 Average Risk   3.4   3.3  Average Risk       5.0   4.4  2 X Average Risk   9.6   7.1  3 X Average Risk  23.4   11.0        Use the calculated Patient Ratio above and the CHD Risk Table to determine the patient's CHD Risk.        ATP III CLASSIFICATION (LDL):  <100     mg/dL   Optimal  100-129  mg/dL   Near or Above                    Optimal  130-159  mg/dL   Borderline  160-189  mg/dL   High  >190  mg/dL   Very High Performed at [facility], [place] [address] [place], [place] [address]   TSH     Status: None   Collection Time: 01/13/17  6:25 PM  Result Value Ref Range   TSH 0.500 0.350 - 4.500 uIU/mL    Comment: Performed by a 3rd Generation assay with a functional sensitivity of <=0.01 uIU/mL. Performed at [facility], [place] [address], [place], [place] [address]   Prolactin     Status: None   Collection Time: 01/13/17  6:25 PM  Result Value Ref Range   Prolactin 6.2 4.8 - 23.3 ng/mL    Comment: (NOTE) Performed At: [facility] [place], [place] [id number] Rush Farmer MD [id number] Performed at [facility], [place] [address], [place], [place] [address]   Lipid panel     Status: None   Collection Time: 01/15/17  6:34 AM  Result Value Ref Range    Cholesterol 155 0 - 200 mg/dL   Triglycerides 94 <150 mg/dL   HDL 44 >40 mg/dL   Total CHOL/HDL Ratio 3.5 RATIO   VLDL 19 0 - 40 mg/dL   LDL Cholesterol 92 0 - 99 mg/dL    Comment:        Total Cholesterol/HDL:CHD Risk Coronary Heart Disease Risk Table                     Men   Women  1/2 Average Risk   3.4   3.3  Average Risk       5.0   4.4  2 X Average Risk   9.6   7.1  3 X Average Risk  23.4   11.0        Use the calculated Patient Ratio above and the CHD Risk Table to determine the patient's CHD Risk.        ATP III CLASSIFICATION (LDL):  <100     mg/dL   Optimal  100-129  mg/dL   Near or Above                    Optimal  130-159  mg/dL   Borderline  160-189  mg/dL   High  >190     mg/dL   Very High Performed at [place] [address] [place], [place] [address]   TSH     Status: None   Collection Time: 01/15/17  6:34 AM  Result Value Ref Range   TSH 1.390 0.350 - 4.500 uIU/mL    Comment: Performed by a 3rd Generation assay with a functional sensitivity of <=0.01 uIU/mL. Performed at [facility], [place] [address], [place], [place] [address]   Prolactin     Status: Abnormal   Collection Time: 01/15/17  6:34 AM  Result Value Ref Range   Prolactin 66.7 (H) 4.8 - 23.3 ng/mL    Comment: (NOTE) Performed At: [facility] [place], [place] [id number] Rush Farmer MD [id number] Performed at [facility], [place] [address] [place], [place] [address]   Hemoglobin A1c     Status: None   Collection Time: 01/15/17  6:34 AM  Result Value Ref Range   Hgb A1c MFr Bld 5.1 4.8 - 5.6 %    Comment: (NOTE) Pre diabetes:          5.7%-6.4% Diabetes:              >6.4% Glycemic control  for   <7.0% adults with diabetes    Mean Plasma Glucose 99.67 mg/dL    Comment: Performed at [facility], [place] [address] [place], [place] [address]   Physical Findings: AIMS: Facial and Oral Movements Muscles of  Facial Expression: None, normal Lips and Perioral Area: None, normal Jaw: None, normal Tongue: None, normal,Extremity Movements Upper (arms, wrists, hands, fingers): None, normal Lower (legs, knees, ankles, toes): None, normal, Trunk Movements Neck, shoulders, hips: None, normal, Overall Severity Severity of abnormal movements (highest score from questions above): None, normal Incapacitation due to abnormal movements: None, normal Patient's awareness of abnormal movements (rate only patient's report): No Awareness, Dental Status Current problems with teeth and/or dentures?: No Does patient usually wear dentures?: No  CIWA:  CIWA-Ar Total: 1 COWS:  COWS Total Score: 2  See Psychiatric Specialty Exam and Suicide Risk Assessment completed by Attending Physician prior to discharge.  Discharge destination:  Home  Is patient on multiple antipsychotic therapies at discharge:  No    Has Patient had three or more failed trials of antipsychotic monotherapy by history:  No  Recommended Plan for Multiple Antipsychotic Therapies: NA  Allergies as of 01/16/2017      Reactions   Lamotrigine Hives   Latuda [lurasidone Hcl] Other (See Comments)   Thoughts of hurting herself   Risperidone And Related Other (See Comments)   hallucinations      Medication List    STOP taking these medications   QUEtiapine 50 MG tablet Commonly known as:  SEROQUEL     TAKE these medications     Indication  gabapentin 300 MG capsule Commonly known as:  NEURONTIN Take 1 capsule (300 mg total) by mouth 3 (three) times daily. For agitation  Indication:  Agitation   nicotine 21 mg/24hr patch Commonly known as:  NICODERM CQ - dosed in mg/24 hours Place 1 patch (21 mg total) onto the skin daily. (May buy from over the counter at the pharmacy): For smoking cessation. Start taking on:  01/17/2017  Indication:  Nicotine Addiction   OLANZapine 20 MG tablet Commonly known as:  ZYPREXA Take 1 tablet (20 mg  total) by mouth at bedtime. For mood control What changed:    medication strength  additional instructions  Indication:  Mood control      Follow-up Information    [facility] Follow up on 01/20/2017.   Why:  Tuesday at 2:00 for your hospital follow up appointment Contact information: [address]  [address] [phone]         Follow-up recommendations: Activity:  As tolerated Diet: As recommended by your primary care doctor. Keep all scheduled follow-up appointments as recommended.   Comments: Patient is instructed prior to discharge to: Take all medications as prescribed by his/her mental healthcare provider. Report any adverse effects and or reactions from the medicines to his/her outpatient provider promptly. Patient has been instructed & cautioned: To not engage in alcohol and or illegal drug use while on prescription medicines. In the event of worsening symptoms, patient is instructed to call the crisis hotline, 911 and or go to the nearest ED for appropriate evaluation and treatment of symptoms. To follow-up with his/her primary care provider for your other medical issues, concerns and or health care needs.   Signed: Encarnacion Slates, PMHNP, FNP-BC 01/16/2017, 10:28 AM   Patient seen, Suicide Assessment Completed.  Disposition Plan Reviewed   Carnell Casamento is a 31 y/o F with history of schizoaffective bipolar type who  was admitted with worsening depression, and SI with plan to drive off a mountain.She was restarted on home medications of zyprexa and gabapentin. Today upon evaluation, she reports she is feeling "better." She denies SI/HI/AH/VH. She is sleeping well and her appetite is good. She has improved perspective about her stressor of altercation with her mother which lead to [place], and she states, "I need to be there for my daughter." Pt initially had made plan to arrive unannounced at her mother's home again after discharge, but we discussed how this could arise in  another conflict, and pt should check ahead of time before arriving at her mother's home as that was the reason for conflict which resulted in this admission. Pt verbalized good understanding, and she will plan to call her family to ask if it is appropriate for her to stay with them over the weekend (and she is accepting of alternative plan to stay at her own home if they decline to invite her to stay over the weekend). Pt was future oriented about follow up at [facility]. Pt was able to engage in safety planning including plan to return to [facility] if she feels unable to maintain her own safety or the safety of others.  She had no further questions, comments, or concerns.    Plan Of Care/Follow-up recommendations:   -Discharge to outpatient level of care  - Schizoaffective bipolar type -Continuezyprexa 12m qhs -Continuegabapentin 3056mTID - anxiety -Continuevistaril 2526m8h prn anxiety/insomnia   Activity:  as tolerated Diet:  normal Tests:  NA Other:  see above for DC plan  ChrPennelope BrackenD

## 2017-01-16 NOTE — Plan of Care (Signed)
Pt was able to identify positive things about self after completion of self esteem recreation therapy session.   Tara RancherMarjette Aeon Thompson, LRT/CTRS

## 2017-01-16 NOTE — Progress Notes (Signed)
  [facility] Dis[facility] :  Will you be returning to the same living situation after discharge:  Yes,  home At discharge, do you have transportation home?: Yes,  car is here Do you have the ability to pay for your medications: Yes,  MCD  Release of information consent forms completed and in the chart;  Patient's signature needed at discharge.  Patient to Follow up at: Follow-up Information    [facility] Follow up on 01/20/2017.   Why:  Tuesday at 2:00 for your hospital follow up appointment Contact information: [address]  [place]  [address] [phone]          Next level of care provider [facility] Link:no  Safety Planning and Suicide Prevention discussed: Yes,  yes  Have you used any form of tobacco in the last 30 days? (Cigarettes, Smokeless Tobacco, Cigars, and/or Pipes): Yes  Has patient been referred to the Quitline?: Patient refused referral  Patient has been referred for addiction treatment: N/A  Ida RogueRodney B Vasil Juhasz, LCSW 01/16/2017, 9:48 AM

## 2017-01-16 NOTE — Progress Notes (Signed)
Recreation Therapy Notes  INPATIENT RECREATION TR PLAN  Patient Details Name: Tara Thompson MRN: [id number] DOB: 21-Jan-1986 Today's Date: 01/16/2017  Rec Therapy Plan Is patient appropriate for Therapeutic Recreation?: Yes Treatment times per week: about 3 days Estimated Length of Stay: 5-7 days TR Treatment/Interventions: Group participation (Comment)  Discharge Criteria Pt will be discharged from therapy if:: Discharged Treatment plan/goals/alternatives discussed and agreed upon by:: Patient/family  Discharge Summary Short term goals set: Pt will identify at least 5 positive qualities about self at completion of recreation therapy sessions. Short term goals met: Complete Progress toward goals comments: Groups attended Which groups?: Self-esteem, Anger management Reason goals not met: None Therapeutic equipment acquired: N/A Reason patient discharged from therapy: Discharge from hospital Pt/family agrees with progress & goals achieved: Yes Date patient discharged from therapy: 01/16/17    Victorino Sparrow, LRT/CTRS  Ria Comment, Kallista Pae A 01/16/2017, 12:38 PM

## 2017-01-16 NOTE — Progress Notes (Signed)
Recreation Therapy Notes  Date: 01/16/17 Time: 1045 Location: Gym  Group Topic: Wellness  Goal Area(s) Addresses:  Patient will define components of whole wellness. Patient will verbalize benefit of whole wellness.  Intervention: 4 rubber bases, rubber ball   Activity: Kickball.  Patients were divided into two teams.  Each team would go until they reached three outs.  The teams would then reverse fields and the other team would get a chance to go.  The team that finished with the most points, wins the game.  Education: Wellness, Building control surveyorDischarge Planning.   Education Outcome: Acknowledges education/In group clarification offered/Needs additional education.   Clinical Observations/Feedback:  Pt did not attend group.    Caroll RancherMarjette Mandisa Persinger, LRT/CTRS         Caroll RancherLindsay, Romulo Okray A 01/16/2017 12:05 PM

## 2017-01-16 NOTE — Progress Notes (Signed)
Tara Thompson was discharged at this time. She denied SI, and all discharge instructions were given and pt verbalized understanding. Nicotine patch was removed. Prescriptions were provided and all belongings were returned. She left ambulatory and in stable condition.
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
## 2017-09-20 ENCOUNTER — Other Ambulatory Visit: Payer: Self-pay

## 2017-09-20 ENCOUNTER — Emergency Department (HOSPITAL_COMMUNITY)
Admission: EM | Admit: 2017-09-20 | Discharge: 2017-09-21 | Disposition: A | Discharge status: Home / Self Care | Payer: Medicare Other | Attending: Emergency Medicine | Admitting: Emergency Medicine

## 2017-09-20 ENCOUNTER — Ambulatory Visit (HOSPITAL_COMMUNITY)
Admission: RE | Admit: 2017-09-20 | Discharge: 2017-09-20 | Disposition: A | Discharge status: Home / Self Care | Payer: Medicaid Other | Attending: Psychiatry | Admitting: Psychiatry

## 2017-09-20 DIAGNOSIS — F332 Major depressive disorder, recurrent severe without psychotic features: Secondary | ICD-10-CM | POA: Diagnosis not present

## 2017-09-20 DIAGNOSIS — F1994 Other psychoactive substance use, unspecified with psychoactive substance-induced mood disorder: Secondary | ICD-10-CM

## 2017-09-20 DIAGNOSIS — F250 Schizoaffective disorder, bipolar type: Secondary | ICD-10-CM | POA: Insufficient documentation

## 2017-09-20 DIAGNOSIS — F3010 Manic episode without psychotic symptoms, unspecified: Secondary | ICD-10-CM

## 2017-09-20 DIAGNOSIS — F1520 Other stimulant dependence, uncomplicated: Secondary | ICD-10-CM | POA: Insufficient documentation

## 2017-09-20 DIAGNOSIS — F11288 Opioid dependence with other opioid-induced disorder: Secondary | ICD-10-CM | POA: Diagnosis not present

## 2017-09-20 DIAGNOSIS — Z79899 Other long term (current) drug therapy: Secondary | ICD-10-CM | POA: Insufficient documentation

## 2017-09-20 DIAGNOSIS — F309 Manic episode, unspecified: Secondary | ICD-10-CM | POA: Diagnosis present

## 2017-09-20 DIAGNOSIS — F1220 Cannabis dependence, uncomplicated: Secondary | ICD-10-CM | POA: Diagnosis not present

## 2017-09-20 DIAGNOSIS — R45851 Suicidal ideations: Secondary | ICD-10-CM | POA: Insufficient documentation

## 2017-09-20 DIAGNOSIS — F17210 Nicotine dependence, cigarettes, uncomplicated: Secondary | ICD-10-CM | POA: Insufficient documentation

## 2017-09-20 DIAGNOSIS — Z1330 Encounter for screening examination for mental health and behavioral disorders, unspecified: Secondary | ICD-10-CM | POA: Diagnosis present

## 2017-09-20 LAB — COMPREHENSIVE METABOLIC PANEL
ALK PHOS: 75 U/L (ref 38–126)
ALT: 13 U/L (ref 0–44)
AST: 23 U/L (ref 15–41)
Albumin: 4.6 g/dL (ref 3.5–5.0)
Anion gap: 10 (ref 5–15)
BUN: 16 mg/dL (ref 6–20)
CALCIUM: 9.2 mg/dL (ref 8.9–10.3)
CO2: 29 mmol/L (ref 22–32)
Chloride: 102 mmol/L (ref 98–111)
Creatinine, Ser: 0.77 mg/dL (ref 0.44–1.00)
GFR calc Af Amer: >60 mL/min (ref >60)
GFR calc non Af Amer: >60 mL/min (ref >60)
Glucose, Bld: 98 mg/dL (ref 70–99)
Potassium: 3.4 mmol/L — ABNORMAL LOW (ref 3.5–5.1)
SODIUM: 141 mmol/L (ref 135–145)
Total Bilirubin: 1 mg/dL (ref 0.3–1.2)
Total Protein: 7.8 g/dL (ref 6.5–8.1)

## 2017-09-20 LAB — CBC WITH DIFFERENTIAL/PLATELET
Basophils Absolute: 0 10*3/uL (ref 0.0–0.1)
Basophils Relative: 0 %
EOS ABS: 0.2 10*3/uL (ref 0.0–0.7)
Eosinophils Relative: 2 %
HCT: 40.5 % (ref 36.0–46.0)
HEMOGLOBIN: 14 g/dL (ref 12.0–15.0)
LYMPHS PCT: 51 %
Lymphs Abs: 6 10*3/uL — ABNORMAL HIGH (ref 0.7–4.0)
MCH: 29.7 pg (ref 26.0–34.0)
MCHC: 34.6 g/dL (ref 30.0–36.0)
MCV: 86 fL (ref 78.0–100.0)
Monocytes Absolute: 0.9 10*3/uL (ref 0.1–1.0)
Monocytes Relative: 8 %
NEUTROS ABS: 4.6 10*3/uL (ref 1.7–7.7)
NEUTROS PCT: 39 %
PLATELETS: 580 10*3/uL — AB (ref 150–400)
RBC: 4.71 MIL/uL (ref 3.87–5.11)
RDW: 12.6 % (ref 11.5–15.5)
WBC: 11.7 10*3/uL — ABNORMAL HIGH (ref 4.0–10.5)

## 2017-09-20 LAB — RAPID URINE DRUG SCREEN, HOSP PERFORMED
Amphetamines: POSITIVE — AB
BARBITURATES: NOT DETECTED
Benzodiazepines: NOT DETECTED
COCAINE: NOT DETECTED
Opiates: NOT DETECTED
TETRAHYDROCANNABINOL: POSITIVE — AB

## 2017-09-20 LAB — ETHANOL: Alcohol, Ethyl (B): <10 mg/dL (ref <10)

## 2017-09-20 LAB — I-STAT BETA HCG BLOOD, ED (MC, WL, AP ONLY)

## 2017-09-20 MED ORDER — OLANZAPINE 10 MG PO TABS
20.0000 mg | ORAL_TABLET | Freq: Every day | ORAL | Status: DC
  Filled 2017-09-20: qty 2

## 2017-09-20 MED ORDER — GABAPENTIN 100 MG PO CAPS
100.0000 mg | ORAL_CAPSULE | Freq: Once | ORAL | Status: AC
  Administered 2017-09-20: 100 mg via ORAL
  Filled 2017-09-20: qty 1

## 2017-09-20 MED ORDER — LORAZEPAM 1 MG PO TABS
5.0000 mg | ORAL_TABLET | Freq: Once | ORAL | Status: AC
  Administered 2017-09-20: 5 mg via ORAL
  Filled 2017-09-20: qty 5

## 2017-09-20 NOTE — ED Triage Notes (Signed)
Patient walked into behavioral health hospital and was sent here by EMS due to bizarre behavior.

## 2017-09-20 NOTE — ED Provider Notes (Signed)
Emergency Department Provider Note   I have reviewed the triage vital signs and the nursing notes.   HISTORY  Chief Complaint No chief complaint on file.   HPI Tara Thompson AmericanKaufman is a 32 y.o. female history of schizoaffective disorder, bipolar and polysubstance abuse who presents the emerge department today secondary to what seems to be a manic state.  Patient states that she is off her medications for a few weeks and has been doing drugs as well.  States that she is been up for the last 5 days but has been having manic type symptoms for the last few weeks.  No medical complaints.  No infectious complaints.  No other associated symptoms. No other associated or modifying symptoms.    Past Medical History:  Diagnosis Date  . Abnormal Pap smear    LSIL>normal follow-up pap  . Anxiety   . Bipolar disorder (HCC)   . Depression   . Mental disorder   . Ovarian cyst     Patient Active Problem List   Diagnosis Date Noted  . Schizoaffective disorder (HCC) 01/13/2017  . Bipolar disorder, current episode mixed, severe, with psychotic features (HCC) 05/26/2014  . Cannabis use disorder, mild, abuse   . Encounter for female sterilization procedure 07/22/2013  . Chronic pelvic pain in female 04/07/2012    Past Surgical History:  Procedure Laterality Date  . COLPOSCOPY    . LAPAROSCOPIC TUBAL LIGATION Bilateral 07/22/2013   Procedure: LAPAROSCOPIC TUBAL LIGATION;  Surgeon: Antionette CharLisa Jackson-Moore, MD;  Location: [facility];  Service: Gynecology;  Laterality: Bilateral;  . WISDOM TOOTH EXTRACTION  age 32    Current Outpatient Rx  . Order #: [id number] Class: Print  . Order #: [id number] Class: Print  . Order #: [id number] Class: No Print    Allergies Lamotrigine; Latuda [lurasidone hcl]; and Risperidone and related  Family History  Problem Relation Age of Onset  . Bipolar disorder Maternal Grandmother   . Cancer Paternal Grandmother   . Depression Maternal Uncle   . Depression Paternal  Aunt   . Drug abuse Brother     Social History Social History   Tobacco Use  . Smoking status: Current Every Day Smoker    Packs/day: 0.50    Years: 11.00    Pack years: 5.50    Types: Cigarettes  . Smokeless tobacco: Never Used  Substance Use Topics  . Alcohol use: No    Alcohol/week: 2.0 standard drinks    Types: 1 Glasses of wine, 1 Cans of beer per week    Comment: occasional - one glass of wine per month  . Drug use: No    Frequency: 7.0 times per week    Comment: Reports she quit THC early July 2014, used cocaine once in July of 2013    Review of Systems  All other systems negative except as documented in the HPI. All pertinent positives and negatives as reviewed in the HPI. ____________________________________________   PHYSICAL EXAM:  VITAL SIGNS: ED Triage Vitals [09/20/17 1703]    Constitutional: Alert and oriented. Well appearing and in no acute distress. Eyes: Conjunctivae are normal. PERRL. EOMI. Head: Atraumatic. Nose: No congestion/rhinnorhea. Mouth/Throat: Mucous membranes are moist.  Oropharynx non-erythematous. Neck: No stridor.  No meningeal signs.   Cardiovascular: Normal rate, regular rhythm. Good peripheral circulation. Grossly normal heart sounds.   Respiratory: Normal respiratory effort.  No retractions. Lungs CTAB. Gastrointestinal: Soft and nontender. No distention.  Musculoskeletal: No lower extremity tenderness nor edema. No gross deformities of extremities. Neurologic:  Normal  speech and language. No gross focal neurologic deficits are appreciated.  Skin:  Skin is warm, dry and intact. No rash noted. Psychiatric: Mood and affect are agitated, pressured speech, rapid movements, flight of ideas, tangential speech..    ____________________________________________   LABS (all labs ordered are listed, but only abnormal results are displayed)  Labs Reviewed  COMPREHENSIVE METABOLIC PANEL - Abnormal; Notable for the following components:       Result Value   Potassium 3.4 (*)    All other components within normal limits  RAPID URINE DRUG SCREEN, HOSP PERFORMED - Abnormal; Notable for the following components:   Amphetamines POSITIVE (*)    Tetrahydrocannabinol POSITIVE (*)    All other components within normal limits  CBC WITH DIFFERENTIAL/PLATELET - Abnormal; Notable for the following components:   WBC 11.7 (*)    Platelets 580 (*)    Lymphs Abs 6.0 (*)    All other components within normal limits  ETHANOL  I-STAT BETA HCG BLOOD, ED ([facility], [facility], [facility] ONLY)   ____________________________________________   INITIAL IMPRESSION / ASSESSMENT AND PLAN / ED COURSE  Patient obviously manic state supplemented by methamphetamine use as well.  Offered IM medications patient prefers to try oral medications first that she wants to be able to be assessed by psychiatry and get back on her normal medications.  Will start those but if patient has any worsening symptoms we will give IM medication.  Pertinent labs & imaging results that were available during my care of the patient were reviewed by me and considered in my medical decision making (see chart for details).  ____________________________________________  FINAL CLINICAL IMPRESSION(S) / ED DIAGNOSES  Final diagnoses:  Manic behavior (HCC)     MEDICATIONS GIVEN DURING THIS VISIT:  Medications  OLANZapine (ZYPREXA) tablet 20 mg (0 mg Oral Hold 09/20/17 2244)  gabapentin (NEURONTIN) capsule 100 mg (100 mg Oral Given 09/20/17 1723)  LORazepam (ATIVAN) tablet 5 mg (5 mg Oral Given 09/20/17 1723)     NEW OUTPATIENT MEDICATIONS STARTED DURING THIS VISIT:  New Prescriptions   No medications on file    Note:  This note was prepared with assistance of Dragon voice recognition software. Occasional wrong-word or sound-a-like substitutions may have occurred due to the inherent limitations of voice recognition software.   Havard Radigan, Barbara Cower, MD 09/21/17 [phone]

## 2017-09-20 NOTE — ED Notes (Signed)
Pt A&O x 3, no distress noted, calm & cooperative, sleeping at present.  Monitoring for safety, Q 15 min checks in effect. 

## 2017-09-20 NOTE — ED Notes (Addendum)
Patient talking rapidly and loud. Reports she has been up for 5 days due to methamphetamine use.  She endorses suicidal thoughts but no plan.  Patient has a history of depression, anxiety, and bipolar disorder.

## 2017-09-20 NOTE — ED Notes (Signed)
Pt was oriented to room and unit.  Pt is very spastic and has pressured speech.  She contracts for safety.  15 minute checks and video monitoring in place.

## 2017-09-20 NOTE — BH Assessment (Signed)
Assessment Note  Tara Thompson is a 32 y.o. female who was brought to [facility] by her father after she was discharged from [facility]. Pt shares she has been using methamphetamine and heroin and that she has been awake for 5 days. Pt is disheveled with unbrushed hair and scabs on her face. She shares she hasn't eaten nor bathed in days, and staff brought pt food to eat and drink. Pt explained to clinician the reason behind her coming to [facility] was because she has not seen her daughter in 3 months and her parents are keeping her away from her. She states her best friend died of an overdose and that the father of her daughter gets to see their daughter because her parents think he's doing well. Pt expressed much frustration regarding her situation and raised her voice, swore, and called her parents names. It was not possible to re-direct pt to get the necessary information for the assessment. Pt expressed a desire to kill herself via walking into traffic or slitting her throat if she didn't get help.  The remainder of pt's assessment was UTA due to pt being actively on drugs and pt stating that she's not currently on her medication due to an inability to get refills.  It was not possible to obtain pt's orientation. Pt's remote and recent memory is intact. Her insight, judgement, and impulse control are impaired at this time.    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 Abnormal Pap smear    LSIL>normal follow-up pap  . Anxiety   . Bipolar disorder ([place])   . Depression   . Mental disorder   . Ovarian cyst     Past Surgical History:  Procedure Laterality Date  . COLPOSCOPY    . LAPAROSCOPIC TUBAL LIGATION Bilateral 07/22/2013   Procedure: LAPAROSCOPIC TUBAL LIGATION;  Surgeon: Lahoma Crocker, MD;  Location: [facility];   Service: Gynecology;  Laterality: Bilateral;  . WISDOM TOOTH EXTRACTION  age 67    Family History:  Family History  Problem Relation Age of Onset  . Bipolar disorder Maternal Grandmother   . Cancer Paternal Grandmother   . Depression Maternal Uncle   . Depression Paternal Aunt   . Drug abuse Brother     Social History:  reports that she has been smoking cigarettes. She has a 5.50 pack-year smoking history. She has never used smokeless tobacco. She reports that she does not drink alcohol or use drugs.  Additional Social History:  Alcohol / Drug Use Pain Medications: Please see MAR Prescriptions: Please see MAR Over the Counter: Please see MAR History of alcohol / drug use?: Yes Longest period of sobriety (when/how long): Unknown Substance #1 Name of Substance 1: Heroin 1 - Age of First Use: Unknown 1 - Amount (size/oz): Unknown 1 - Frequency: Unknown 1 - Duration: Unknown 1 - Last Use / Amount: Unknown Substance #2 Name of Substance 2: Methamphetamine 2 - Age of First Use: Unknown 2 - Amount (size/oz): Unknown 2 - Frequency: Unknown 2 - Duration: Unknown 2 - Last Use / Amount: Unknown  CIWA:   COWS:    Allergies:  Allergies  Allergen Reactions  . Lamotrigine Hives  . Latuda [Lurasidone Hcl] Other (See Comments)    Thoughts of hurting herself  . Risperidone And Related Other (See Comments)    hallucinations  Home Medications:  (Not in a hospital admission)  OB/GYN Status:  No LMP recorded. (Menstrual status: Other).  General Assessment Data Location of Assessment: [facility] Assessment Services TTS Assessment: In system Is this a Tele or Face-to-Face Assessment?: Face-to-Face Is this an Initial Assessment or a Re-assessment for this encounter?: Initial Assessment Marital status: Single Maiden name: Spike Is patient pregnant?: No Pregnancy Status: No Living Arrangements: Other (Comment)(Pt is homeless) Can pt return to current living arrangement?:  Yes Admission Status: Voluntary Is patient capable of signing voluntary admission?: Yes Referral Source: Self/Family/Friend Insurance type: Medicare  Medical Screening Exam ([place]) Medical Exam completed: Yes  Crisis Care Plan Living Arrangements: Other (Comment)(Pt is homeless) Legal Guardian: Other:(None) Name of Psychiatrist: [place] Name of Therapist: UTA  Education Status Is patient currently in school?: No Is the patient employed, unemployed or receiving disability?: Unemployed  Risk to self with the past 6 months Suicidal Ideation: Yes-Currently Present Has patient been a risk to self within the past 6 months prior to admission? : Yes Suicidal Intent: Yes-Currently Present Has patient had any suicidal intent within the past 6 months prior to admission? : Yes Is patient at risk for suicide?: Yes Suicidal Plan?: Yes-Currently Present Has patient had any suicidal plan within the past 6 months prior to admission? : Yes Specify Current Suicidal Plan: Pt plans to walk into traffic or slit her throat Access to Means: Yes Specify Access to Suicidal Means: Pt has access to busy roads What has been your use of drugs/alcohol within the last 12 months?: Pt stated she has been using heroin and methamphetamine Previous Attempts/Gestures: (UTA) How many times?: (UTA) Other Self Harm Risks: (Pt is off of her anti-psychotics) Triggers for Past Attempts: Other (Comment)(UTA) Intentional Self Injurious Behavior: (UTA) Family Suicide History: Unable to assess Recent stressful life event(s): Loss (Comment), Conflict (Comment)(Best friend just o/d (& died), she cannot see her daugher) Persecutory voices/beliefs?: (UTA) Depression: Yes Depression Symptoms: Insomnia, Feeling worthless/self pity, Feeling angry/irritable Substance abuse history and/or treatment for substance abuse?: Yes Suicide prevention information given to non-admitted patients: Not applicable  Risk to Others  within the past 6 months Homicidal Ideation: No Does patient have any lifetime risk of violence toward others beyond the six months prior to admission? : No Thoughts of Harm to Others: No Current Homicidal Intent: No Current Homicidal Plan: No Access to Homicidal Means: No Identified Victim: None noted History of harm to others?: (Unknown) Assessment of Violence: On admission Violent Behavior Description: (Unknown) Does patient have access to weapons?: (UTA) Criminal Charges Pending?: (UTA) Does patient have a court date: (UTA) Is patient on probation?: (UTA)  Psychosis Hallucinations: (UTA) Delusions: (UTA)  Mental Status Report Appearance/Hygiene: Body odor, Disheveled, Poor hygiene, Other (Comment)(Has been on drugs & stated has not showered in several days) Eye Contact: Fair Motor Activity: Agitation, Gestures, Hyperactivity, Mannerisms, Restlessness Speech: Incoherent, Rapid, Loud, Aggressive(Pt was rambling, angry at her family & ex at times) Level of Consciousness: Alert, Restless, Irritable Mood: Anxious Affect: Anxious Anxiety Level: Moderate Thought Processes: Flight of Ideas Judgement: Impaired Orientation: Person, Unable to assess Obsessive Compulsive Thoughts/Behaviors: Moderate  Cognitive Functioning Concentration: Poor Memory: Recent Intact, Remote Intact Is patient IDD: No Is patient DD?: Yes Insight: Poor Impulse Control: Poor Appetite: Poor Have you had any weight changes? : (UTA) Sleep: Decreased Total Hours of Sleep: (UTA - pt states she has not slept in 5 days) Vegetative Symptoms: Not bathing, Decreased grooming(Symptoms due to substance use)  ADLScreening [facility] Assessment Services) Patient's  cognitive ability adequate to safely complete daily activities?: No Patient able to express need for assistance with ADLs?: Yes Independently performs ADLs?: Yes (appropriate for developmental age)  Prior Inpatient Therapy Prior Inpatient Therapy:  (UTA)  Prior Outpatient Therapy Prior Outpatient Therapy: (UTA)  ADL Screening (condition at time of admission) Patient's cognitive ability adequate to safely complete daily activities?: No Is the patient deaf or have difficulty hearing?: No Does the patient have difficulty seeing, even when wearing glasses/contacts?: No Does the patient have difficulty concentrating, remembering, or making decisions?: Yes Patient able to express need for assistance with ADLs?: Yes Does the patient have difficulty dressing or bathing?: Yes Independently performs ADLs?: Yes (appropriate for developmental age) Does the patient have difficulty walking or climbing stairs?: No Weakness of Legs: None Weakness of Arms/Hands: None     Therapy Consults (therapy consults require a physician order) PT Evaluation Needed: No OT Evalulation Needed: No SLP Evaluation Needed: No Abuse/Neglect Assessment (Assessment to be complete while patient is alone) Abuse/Neglect Assessment Can Be Completed: Unable to assess, patient is non-responsive or altered mental status(Pt is currently on methamphetamine &/or heroin and is paranoid so cannot complete the assessment) Values / Beliefs Cultural Requests During Hospitalization: None Spiritual Requests During Hospitalization: None Consults Spiritual Care Consult Needed: No Social Work Consult Needed: No Regulatory affairs officer (For Healthcare) Does Patient Have a Medical Advance Directive?: No Would patient like information on creating a medical advance directive?: No - Patient declined    Additional Information 1:1 In Past 12 Months?: (UTA) CIRT Risk: (UTA) Elopement Risk: (UTA) Does patient have medical clearance?: No(Pt is going to [facility] medical clearance)     Disposition: Priscille Loveless NP reviewed pt's chart and information and met with pt and determined that pt meets criteria for inpatient hospitalization. Pt will be transferred to [facility] ED first to ensure  she is medically cleared before she is placed on the unit.   Disposition Initial Assessment Completed for this Encounter: Yes Disposition of Patient: Admit(Takia Starkes NP determined pt meets inpt hosp criteria) Type of inpatient treatment program: Adult Patient refused recommended treatment: No Mode of transportation if patient is discharged?: N/A Patient referred to: (Pt is going to [facility] for medical clearance)  On Site Evaluation by:   Reviewed with Physician:    Dannielle Burn 09/20/2017 5:25 PM

## 2017-09-21 ENCOUNTER — Encounter (HOSPITAL_COMMUNITY): Payer: Self-pay

## 2017-09-21 ENCOUNTER — Emergency Department (HOSPITAL_COMMUNITY)
Admission: EM | Admit: 2017-09-21 | Discharge: 2017-09-22 | Disposition: A | Discharge status: Home / Self Care | Payer: Medicare Other | Source: Home / Self Care | Attending: Emergency Medicine | Admitting: Emergency Medicine

## 2017-09-21 DIAGNOSIS — R45851 Suicidal ideations: Secondary | ICD-10-CM

## 2017-09-21 DIAGNOSIS — F17210 Nicotine dependence, cigarettes, uncomplicated: Secondary | ICD-10-CM

## 2017-09-21 DIAGNOSIS — F1594 Other stimulant use, unspecified with stimulant-induced mood disorder: Secondary | ICD-10-CM

## 2017-09-21 DIAGNOSIS — F332 Major depressive disorder, recurrent severe without psychotic features: Secondary | ICD-10-CM

## 2017-09-21 DIAGNOSIS — F1520 Other stimulant dependence, uncomplicated: Secondary | ICD-10-CM | POA: Diagnosis present

## 2017-09-21 DIAGNOSIS — Z79899 Other long term (current) drug therapy: Secondary | ICD-10-CM | POA: Insufficient documentation

## 2017-09-21 DIAGNOSIS — F1220 Cannabis dependence, uncomplicated: Secondary | ICD-10-CM | POA: Insufficient documentation

## 2017-09-21 DIAGNOSIS — F1994 Other psychoactive substance use, unspecified with psychoactive substance-induced mood disorder: Secondary | ICD-10-CM | POA: Diagnosis not present

## 2017-09-21 DIAGNOSIS — F309 Manic episode, unspecified: Secondary | ICD-10-CM | POA: Diagnosis not present

## 2017-09-21 DIAGNOSIS — F1910 Other psychoactive substance abuse, uncomplicated: Secondary | ICD-10-CM

## 2017-09-21 LAB — COMPREHENSIVE METABOLIC PANEL
ALK PHOS: 62 U/L (ref 38–126)
ALT: 14 U/L (ref 0–44)
AST: 21 U/L (ref 15–41)
Albumin: 4.1 g/dL (ref 3.5–5.0)
Anion gap: 9 (ref 5–15)
BUN: 14 mg/dL (ref 6–20)
CALCIUM: 9.1 mg/dL (ref 8.9–10.3)
CHLORIDE: 101 mmol/L (ref 98–111)
CO2: 28 mmol/L (ref 22–32)
CREATININE: 0.95 mg/dL (ref 0.44–1.00)
Glucose, Bld: 119 mg/dL — ABNORMAL HIGH (ref 70–99)
Potassium: 3.1 mmol/L — ABNORMAL LOW (ref 3.5–5.1)
Sodium: 138 mmol/L (ref 135–145)
Total Bilirubin: 1.2 mg/dL (ref 0.3–1.2)
Total Protein: 6.9 g/dL (ref 6.5–8.1)

## 2017-09-21 LAB — CBC
HCT: 42.4 % (ref 36.0–46.0)
Hemoglobin: 13.8 g/dL (ref 12.0–15.0)
MCH: 29.7 pg (ref 26.0–34.0)
MCHC: 32.5 g/dL (ref 30.0–36.0)
MCV: 91.2 fL (ref 78.0–100.0)
PLATELETS: 459 10*3/uL — AB (ref 150–400)
RBC: 4.65 MIL/uL (ref 3.87–5.11)
RDW: 12.6 % (ref 11.5–15.5)
WBC: 10.7 10*3/uL — ABNORMAL HIGH (ref 4.0–10.5)

## 2017-09-21 LAB — RAPID URINE DRUG SCREEN, HOSP PERFORMED
Amphetamines: POSITIVE — AB
Barbiturates: NOT DETECTED
Benzodiazepines: POSITIVE — AB
Cocaine: NOT DETECTED
OPIATES: NOT DETECTED
Tetrahydrocannabinol: POSITIVE — AB

## 2017-09-21 LAB — I-STAT BETA HCG BLOOD, ED (MC, WL, AP ONLY): I-stat hCG, quantitative: <5 m[IU]/mL (ref <5)

## 2017-09-21 LAB — ACETAMINOPHEN LEVEL: Acetaminophen (Tylenol), Serum: <10 ug/mL — ABNORMAL LOW (ref 10–30)

## 2017-09-21 LAB — ETHANOL

## 2017-09-21 LAB — SALICYLATE LEVEL

## 2017-09-21 MED ORDER — OLANZAPINE 10 MG PO TABS: 10.0000 mg | ORAL_TABLET | Freq: Two times a day (BID) | ORAL | 0 refills | Status: AC

## 2017-09-21 MED ORDER — GABAPENTIN 300 MG PO CAPS: 300.0000 mg | ORAL_CAPSULE | Freq: Three times a day (TID) | ORAL | 0 refills | Status: AC

## 2017-09-21 MED ORDER — POTASSIUM CHLORIDE CRYS ER 20 MEQ PO TBCR
40.0000 meq | EXTENDED_RELEASE_TABLET | Freq: Once | ORAL | Status: AC
  Administered 2017-09-21: 40 meq via ORAL
  Filled 2017-09-21: qty 2

## 2017-09-21 MED ORDER — OLANZAPINE 10 MG PO TABS
10.0000 mg | ORAL_TABLET | Freq: Two times a day (BID) | ORAL | Status: DC
  Administered 2017-09-21: 10 mg via ORAL
  Filled 2017-09-21: qty 1

## 2017-09-21 NOTE — BH Assessment (Signed)
[facility] Progress Note  Per Juanetta BeetsJacqueline Norman, DO, this pt does not require psychiatric hospitalization at this time.  [facility] with [facility] to [facility], or with [facility] in [place], pt's community or residence.  This has been included in pt's discharge instructions.  Pt would also benefit from seeing Peer Support Specialists; they will be asked to speak to pt.  Pt's nurse, Aram BeechamCynthia, has been notified.  Doylene Canninghomas Vici Novick, MA Triage Specialist [phone]
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
## 2017-09-21 NOTE — ED Notes (Signed)
Patient to be seen by peer support.
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
## 2017-09-21 NOTE — Discharge Instructions (Signed)
For your behavioral health needs, you are advised to follow up with one of the following providers: °  °IN [place]: °  °     [facility] °     [address] °     [place], [place] [address] °     [phone] °     New and returning patients are seen at their walk-in clinic.  Walk-in hours are Monday - Friday from 8:00 am - 3:00 pm.  Walk-in patients are seen on a first come, first served basis.  Try to arrive as early as possible for he best chance of being seen the same day. °  °IN [place]: °  °     [facility] °     [address] °     [place], [place] [address] °     [phone] °

## 2017-09-21 NOTE — Patient Outreach (Signed)
ED Peer Support Specialist Patient Intake (Complete at intake & 30-60 Day Follow-up)  Name: Tara Thompson  MRN: [id number]  Age: 32 y.o.   Date of Admission: 09/21/2017  Intake: Initial Comments:      Primary Reason Admitted: SI, poly substance use with heroin, methamphrtamine, and marijuana   Lab values: Alcohol/ETOH: Negative Positive UDS? Yes Amphetamines: Yes Barbiturates: Yes Benzodiazepines: No Cocaine: No Opiates: No Cannabinoids: No  Demographic information: Gender: Female Ethnicity: White Marital Status: Single Insurance Status: Medicare(Medicare Part A and B) Ecologist (Work Neurosurgeon, Physicist, medical, Social research officer, government.: Yes(Disability) Lives with: Alone Living situation: Homeless  Reported Patient History: Patient reported health conditions: Depression, Schizoaffective disorder, Bipolar disorder Patient aware of HIV and hepatitis status: No  In past year, has patient visited ED for any reason? Yes  Number of ED visits: 1  Reason(s) for visit: poly substance use, SI   In past year, has patient been hospitalized for any reason? Yes  Number of hospitalizations: 1  Reason(s) for hospitalization: poly substance use, SI  In past year, has patient been arrested? No  Number of arrests:    Reason(s) for arrest:    In past year, has patient been incarcerated? No  Number of incarcerations:    Reason(s) for incarceration:    In past year, has patient received medication-assisted treatment? No  In past year, patient received the following treatments: ([facility] [place])  In past year, has patient received any harm reduction services? No  Did this include any of the following?    In past year, has patient received care from a mental health provider for diagnosis other than SUD? No  In past year, is this first time patient has overdosed? (Has not overdosed)  Number of past overdoses:    In past year, is this first time patient has  been hospitalized for an overdose? (has not overdosed)  Number of hospitalizations for overdose(s):    Is patient currently receiving treatment for a mental health diagnosis? No  Patient reports experiencing difficulty participating in SUD treatment: No    Most important reason(s) for this difficulty?    Has patient received prior services for treatment? No  In past, patient has received services from following agencies:    Plan of Care:  Suggested follow up at these agencies/treatment centers: Other (comment)(Patient wants to get connected to [facility] or other mental health hospitalization. )  Other information: CPSS met with the patient to provide substance use recovery support. Patient wants to go [facility] for hospitalization to get back on her meds and help with her mental health. CPSS explained to the patient that CPSS does not control whether or not someone is accepted to [facility]. CPSS will talk to the patient about other options for substance use/mental health treatment and CPSS contact information.     Mason Jim, CPSS  09/21/2017 11:56 AM

## 2017-09-21 NOTE — ED Notes (Addendum)
Patient wearing paper scrubs , sitter at bedside , pt. wanded by security .

## 2017-09-21 NOTE — Consult Note (Addendum)
[facility]  09/21/2017 10:31 AM Tara Thompson  MRN:  [id number] Principal Problem: Substance induced mood disorder [facility](HCC) Discharge Diagnoses:  Patient Active Problem List   Diagnosis Date Noted  . Methamphetamine use disorder, moderate (HCC) [F15.20] 09/21/2017    Priority: High  . Substance induced mood disorder (HCC) [F19.94] 09/21/2017    Priority: High  . Cannabis use disorder, mild, abuse [F12.10]   . Encounter for female sterilization procedure [Z30.2] 07/22/2013  . Chronic pelvic pain in female [R10.2, G89.29] 04/07/2012    Subjective: 32 yo female who presented to the ED after using meth for 5 days and not sleeping.  Denies suicidal/homicidal ideations, hallucinations, or withdrawal symptoms.  She has been using drugs for years and increase use with stress of not being allowed to see her child.  Decreases with support and medications.  Anxiety is present but mild.  Peer support consult placed and then discharged.  Released from [facility] 5 days ago and unfortunately returned to use immediately.  Total Time spent with patient: 45 minutes  Past Psychiatric History: substance abuse  Past Medical History:  Past Medical History:  Diagnosis Date  . Abnormal Pap smear    LSIL>normal follow-up pap  . Anxiety   . Bipolar disorder (HCC)   . Depression   . Mental disorder   . Ovarian cyst    Past Surgical History:  Procedure Laterality Date  . COLPOSCOPY    . LAPAROSCOPIC TUBAL LIGATION Bilateral 07/22/2013   Procedure: LAPAROSCOPIC TUBAL LIGATION;  Surgeon: Antionette CharLisa Jackson-Moore, MD;  Location: [facility];  Service: Gynecology;  Laterality: Bilateral;  . WISDOM TOOTH EXTRACTION  age 32   Family History:  Family History  Problem Relation Age of Onset  . Bipolar disorder Maternal Grandmother   . Cancer Paternal Grandmother   . Depression Maternal Uncle   . Depression Paternal Aunt   . Drug abuse Brother    Family Psychiatric  History: As stated above.  Social  History:  Social History   Substance and Sexual Activity  Alcohol Use No  . Alcohol/week: 2.0 standard drinks  . Types: 1 Glasses of wine, 1 Cans of beer per week   Comment: occasional - one glass of wine per month    Social History   Substance and Sexual Activity  Drug Use No  . Frequency: 7.0 times per week   Comment: Reports she quit THC early July 2014, used cocaine once in July of 2013   Social History   Socioeconomic History  . Marital status: Single    Spouse name: Not on file  . Number of children: Not on file  . Years of education: Not on file  . Highest education level: Not on file  Occupational History  . Not on file  Social Needs  . Financial resource strain: Not on file  . Food insecurity:    Worry: Not on file    Inability: Not on file  . Transportation needs:    Medical: Not on file    Non-medical: Not on file  Tobacco Use  . Smoking status: Current Every Day Smoker    Packs/day: 0.50    Years: 11.00    Pack years: 5.50    Types: Cigarettes  . Smokeless tobacco: Never Used  Substance and Sexual Activity  . Alcohol use: No    Alcohol/week: 2.0 standard drinks    Types: 1 Glasses of wine, 1 Cans of beer per week    Comment: occasional - one glass of  wine per month  . Drug use: No    Frequency: 7.0 times per week    Comment: Reports she quit THC early July 2014, used cocaine once in July of 2013  . Sexual activity: Yes    Birth control/protection: Condom, Surgical    Comment: Tubal Ligation   Lifestyle  . Physical activity:    Days per week: Not on file    Minutes per session: Not on file  . Stress: Not on file  Relationships  . Social connections:    Talks on phone: Not on file    Gets together: Not on file    Attends religious service: Not on file    Active member of club or organization: Not on file    Attends meetings of clubs or organizations: Not on file    Relationship status: Not on file  Other Topics Concern  . Not on file  Social  History Narrative   08/25/2012 [facility]  Tara Thompson was born and grew up in [place], [place]. She has a younger brother. Her parents divorced when she was 38 years old. She reports that her childhood was "awful." She reports that she witnessed her parents have frequent verbal confrontations. She graduated from high school. 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 She has never been married. She has a 51-year-old daughter. She currently lives with her mother and her daughter. Her hobbies include reading and painting. She affiliates as a Curator. She denies any legal difficulties. She reports that her social support system consists of her mother and father. 08/25/2012 [facility]     Has this patient used any form of tobacco in the last 30 days? (Cigarettes, Smokeless Tobacco, Cigars, and/or Pipes) A prescription for an FDA-approved tobacco cessation medication was offered at discharge and the patient refused  Current Medications: Current Facility-Administered Medications  Medication Dose Route Frequency Provider Last Rate Last Dose  . OLANZapine (ZYPREXA) tablet 10 mg  10 mg Oral BID Charm Rings, NP   10 mg at 09/21/17 1610   Current Outpatient Medications  Medication Sig Dispense Refill  . gabapentin (NEURONTIN) 300 MG capsule Take 1 capsule (300 mg total) by mouth 3 (three) times daily. For agitation 30 capsule 0  . OLANZapine (ZYPREXA) 20 MG tablet Take 1 tablet (20 mg total) by mouth at bedtime. For mood control 30 tablet 0  . nicotine (NICODERM CQ - DOSED IN MG/24 HOURS) 21 mg/24hr patch Place 1 patch (21 mg total) onto the skin daily. (May buy from over the counter at the pharmacy): For smoking cessation. (Patient not taking: Reported on 09/20/2017) 28 patch 0   PTA Medications:  (Not in a hospital admission)  Musculoskeletal: Strength & Muscle Tone: within normal limits Gait & Station: normal Patient leans:  N/A  Psychiatric Specialty Exam: Physical Exam  Nursing note and vitals reviewed. Constitutional: She is oriented to person, place, and time. She appears well-developed and well-nourished.  HENT:  Head: Normocephalic and atraumatic.  Neck: Normal range of motion.  Respiratory: Effort normal.  Musculoskeletal: Normal range of motion.  Neurological: She is alert and oriented to person, place, and time.  Psychiatric: Her speech is normal and behavior is normal. Judgment and thought content normal. Her mood appears anxious. Her affect is blunt. Cognition and memory are normal.    Review of Systems  Psychiatric/Behavioral: Positive for substance abuse. The patient is nervous/anxious.   All other systems reviewed and are negative.  Blood pressure 127/84, pulse 93, temperature 98 F (36.7 C), temperature source Oral, resp. rate 18, SpO2 100 %.There is no height or weight on file to calculate BMI.  General Appearance: Disheveled  Eye Contact:  Good  Speech:  Normal Rate  Volume:  Normal  Mood:  Anxious  Affect:  Blunt  Thought Process:  Coherent and Descriptions of Associations: Intact  Orientation:  Full (Time, Place, and Person)  Thought Content:  WDL and Logical  Suicidal Thoughts:  No  Homicidal Thoughts:  No  Memory:  Immediate;   Good Recent;   Good Remote;   Good  Judgement:  Fair  Insight:  Fair  Psychomotor Activity:  Normal  Concentration:  Concentration: Good and Attention Span: Good  Recall:  Good  Fund of Knowledge:  Fair  Language:  Good  Akathisia:  No  Handed:  Right  AIMS (if indicated):   N/A  Assets:  Leisure Time Physical Health Resilience Social Support  ADL's:  Intact  Cognition:  WNL  Sleep:   N/A     Demographic Factors:  Caucasian  Loss Factors: NA  Historical Factors: NA  Risk Reduction Factors:   Sense of responsibility to family and Positive social support  Continued Clinical Symptoms:  Anxiety,mild  Cognitive Features That  Contribute To Risk:  None    Suicide Risk:  Minimal: No identifiable suicidal ideation.  Patients presenting with no risk factors but with morbid ruminations; may be classified as minimal risk based on the severity of the depressive symptoms    Plan Of Care/Follow-up recommendations:  Activity:  as tolerated Diet:  heart healthy diet  Disposition: discharge home Nanine MeansLORD, JAMISON, NP 09/21/2017, 10:31 AM   Patient seen face-to-face for psychiatric evaluation, chart reviewed and case discussed with the physician extender and developed treatment plan. Reviewed the information documented and agree with the treatment plan.  Juanetta BeetsJacqueline Norman, DO 09/21/17 2:03 PM

## 2017-09-21 NOTE — ED Notes (Signed)
Peer support trying to get [facility].

## 2017-09-21 NOTE — ED Notes (Signed)
Rechecked patient's O2 sats-100%

## 2017-09-22 ENCOUNTER — Encounter (HOSPITAL_COMMUNITY): Payer: Self-pay | Admitting: Registered Nurse

## 2017-09-22 DIAGNOSIS — F309 Manic episode, unspecified: Secondary | ICD-10-CM | POA: Diagnosis not present

## 2017-09-22 MED ORDER — GABAPENTIN 300 MG PO CAPS: 300.0000 mg | ORAL_CAPSULE | Freq: Three times a day (TID) | ORAL | 0 refills | Status: DC

## 2017-09-22 MED ORDER — OLANZAPINE 10 MG PO TABS: 10.0000 mg | ORAL_TABLET | Freq: Two times a day (BID) | ORAL | 0 refills | Status: DC

## 2017-09-22 MED ORDER — OLANZAPINE 5 MG PO TABS
10.0000 mg | ORAL_TABLET | Freq: Two times a day (BID) | ORAL | Status: DC
  Administered 2017-09-22 (×2): 10 mg via ORAL
  Filled 2017-09-22: qty 2
  Filled 2017-09-22 (×2): qty 1

## 2017-09-22 MED ORDER — HALOPERIDOL LACTATE 5 MG/ML IJ SOLN
5.0000 mg | Freq: Once | INTRAMUSCULAR | Status: AC
  Administered 2017-09-22: 5 mg via INTRAMUSCULAR
  Filled 2017-09-22: qty 1

## 2017-09-22 MED ORDER — ONDANSETRON HCL 4 MG PO TABS: 4.0000 mg | ORAL_TABLET | Freq: Three times a day (TID) | ORAL | Status: DC | PRN

## 2017-09-22 MED ORDER — ALUM & MAG HYDROXIDE-SIMETH 200-200-20 MG/5ML PO SUSP: 30.0000 mL | Freq: Four times a day (QID) | ORAL | Status: DC | PRN

## 2017-09-22 MED ORDER — ACETAMINOPHEN 325 MG PO TABS: 650.0000 mg | ORAL_TABLET | ORAL | Status: DC | PRN

## 2017-09-22 MED ORDER — GABAPENTIN 300 MG PO CAPS
300.0000 mg | ORAL_CAPSULE | Freq: Three times a day (TID) | ORAL | Status: DC
  Administered 2017-09-22: 300 mg via ORAL
  Filled 2017-09-22: qty 1

## 2017-09-22 MED ORDER — ZOLPIDEM TARTRATE 5 MG PO TABS: 5.0000 mg | ORAL_TABLET | Freq: Every evening | ORAL | Status: DC | PRN

## 2017-09-22 MED ORDER — NICOTINE 21 MG/24HR TD PT24
21.0000 mg | MEDICATED_PATCH | Freq: Every day | TRANSDERMAL | Status: DC
  Administered 2017-09-22: 21 mg via TRANSDERMAL
  Filled 2017-09-22: qty 1

## 2017-09-22 NOTE — ED Notes (Addendum)
TTS video interview completed. 

## 2017-09-22 NOTE — Discharge Instructions (Addendum)
The behavioral health team felt you are now psychiatrically cleared for outpatient management.  Please follow-up as directed.  Please use your home medicines.  If any symptoms change or worsen, please return to the nearest emergency department.

## 2017-09-22 NOTE — H&P (Addendum)
Behavioral Health Medical Screening Exam  Pauline AusMadison M Carlos AmericanKaufman is an 32 y.o. female.  Total Time spent with patient: 20 minutes  Psychiatric Specialty Exam: Physical Exam  Vitals reviewed. Cardiovascular: Normal rate.  Neurological: She is alert.  Psychiatric: She has a normal mood and affect. Her behavior is normal.    Review of Systems  Psychiatric/Behavioral: Positive for depression and suicidal ideas. The patient is nervous/anxious.     There were no vitals taken for this visit.There is no height or weight on file to calculate BMI.  General Appearance: Bizarre and Disheveled  Eye Contact:  Fair  Speech:  Clear and Coherent and Pressured  Volume:  Increased  Mood:  Anxious and Euphoric  Affect:  Inappropriate and Full Range  Thought Process:  Coherent and Disorganized  Orientation:  Full (Time, Place, and Person)  Thought Content:  Hallucinations: Auditory, Paranoid Ideation and Rumination  Suicidal Thoughts:  Yes.  with intent/plan  Homicidal Thoughts:  No  Memory:  Immediate;   Fair Recent;   Fair Remote;   Fair  Judgement:  Impaired  Insight:  Lacking  Psychomotor Activity:  Increased  Concentration: Concentration: Fair  Recall:  FiservFair  Fund of Knowledge:Fair  Language: Fair  Akathisia:  No  Handed:  Right  AIMS (if indicated):     Assets:  Communication Skills Desire for Improvement Social Support  Sleep:       Musculoskeletal: Strength & Muscle Tone: within normal limits Gait & Station: normal Patient leans: N/A  There were no vitals taken for this visit.  Recommendations:  Based on my evaluation the patient appears to have an emergency medical condition for which I recommend the patient be transferred to the emergency department for further evaluation.    Truman Haywardakia S Starkes, FNP 09/22/2017, 3:35 PM   Agree with NP assessment
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
## 2017-09-22 NOTE — ED Notes (Signed)
"  I want a doctor to shave my head off! Is there a doctor here who can do that?" Sitter at bedside. Patient given Algis Liminghairbrush.

## 2017-09-22 NOTE — ED Notes (Signed)
Pt now awake and request "fucking hairbrush" pt is pacing and begins speaking louder and more pressured speech. Pt given comb and begins raking it throuygh hair. Pt offered Haldol and refuseds. Requesting ativan. PA aware.

## 2017-09-22 NOTE — ED Notes (Signed)
Pt again walking in hallway, redirected back to stretcher.

## 2017-09-22 NOTE — ED Notes (Addendum)
Personal belongings inventoried and stored at locker#6 at [facility] , valuables stored at security safe .

## 2017-09-22 NOTE — Progress Notes (Signed)
Disposition CSW called and spoke to pt's RN, Cricket H-V, and related that patient has been psych cleared and is recommended for d/c.  CSW [place] fax [place] facilities [place] but patient is encouraged to follow up with [facility] or [facility] [place].  CSW related that NP made the recommendation that patient be given 1-2 weeks of her psych meds prior to d/c.  Carney BernJean T. Kaylyn LimSutter, MSW, LCSWA Disposition Clinical Social Work [phone] (cell) [phone] (office)

## 2017-09-22 NOTE — ED Notes (Signed)
TTS called to report pt will be discharged and they would like to recommend pt gets prescriptions for 1-2 weeks of her medications

## 2017-09-22 NOTE — ED Notes (Signed)
Pt moved to E48 temporarily for telepsych eval.  Telepsych computer at bedside.

## 2017-09-22 NOTE — ED Notes (Signed)
TTS counselor advised RN that pt. will be reevaluated by psychiatrist in the morning .

## 2017-09-22 NOTE — ED Notes (Signed)
TTS called for patient

## 2017-09-22 NOTE — ED Provider Notes (Signed)
3:15 PM Was just called by the behavioral health team to see the patient is now psychiatrically cleared for discharge home.  We will send her resources for outpatient management.  They requested to be given 2 weeks of her psych medications which will be ordered.    Patient was reassessed and says she has no other complaints and understands return precautions.  Patient discharged in good condition.   Clinical Impression: 1. Suicidal ideation   2. Polysubstance abuse (HCC)   3. Other stimulant-induced mood disorder (HCC)     Disposition: Discharge  Condition: Good  I have discussed the results, Dx and Tx plan with the pt(& family if present). He/she/they expressed understanding and agree(s) with the plan. Discharge instructions discussed at great length. Strict return precautions discussed and pt &/or family have verbalized understanding of the instructions. No further questions at time of discharge.    New Prescriptions   GABAPENTIN (NEURONTIN) 300 MG CAPSULE    Take 1 capsule (300 mg total) by mouth 3 (three) times daily for 14 days.   OLANZAPINE (ZYPREXA) 10 MG TABLET    Take 1 tablet (10 mg total) by mouth 2 (two) times daily for 14 days.    Follow Up: [facility] [address] [id number] [place] [id number] [phone]       Tegeler, Canary Brimhristopher J, MD 09/22/17 [phone]

## 2017-09-22 NOTE — ED Notes (Signed)
Pt returned from shower
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
## 2017-09-22 NOTE — ED Notes (Signed)
Pt states "I know what haldol does to me. It knocks me out. I don't want haldol. I'm going to f*cking kill somebody if I don't get a nicotine patch." MD made aware. IVC paperwork in the process of being completed at this time.

## 2017-09-22 NOTE — ED Provider Notes (Signed)
Patient committed by me for making further suicidal statements and refusing intervention and trying to leave ED.     Madysun Thall, MD 09/22/17 0800

## 2017-09-22 NOTE — ED Notes (Signed)
Pt transferred to pod F, pt is calm and cooperative at this time, able pt ambulate from stretcher to bed.

## 2018-02-21 ENCOUNTER — Emergency Department (HOSPITAL_COMMUNITY)
Admission: EM | Admit: 2018-02-21 | Discharge: 2018-02-23 | Disposition: A | Discharge status: Home / Self Care | Payer: Medicare Other | Attending: Emergency Medicine | Admitting: Emergency Medicine

## 2018-02-21 ENCOUNTER — Other Ambulatory Visit: Payer: Self-pay

## 2018-02-21 DIAGNOSIS — F1910 Other psychoactive substance abuse, uncomplicated: Secondary | ICD-10-CM

## 2018-02-21 DIAGNOSIS — N183 Chronic kidney disease, stage 3 (moderate): Secondary | ICD-10-CM | POA: Insufficient documentation

## 2018-02-21 DIAGNOSIS — F1994 Other psychoactive substance use, unspecified with psychoactive substance-induced mood disorder: Secondary | ICD-10-CM | POA: Diagnosis not present

## 2018-02-21 DIAGNOSIS — J111 Influenza due to unidentified influenza virus with other respiratory manifestations: Secondary | ICD-10-CM | POA: Diagnosis not present

## 2018-02-21 DIAGNOSIS — Z7982 Long term (current) use of aspirin: Secondary | ICD-10-CM | POA: Diagnosis not present

## 2018-02-21 DIAGNOSIS — E119 Type 2 diabetes mellitus without complications: Secondary | ICD-10-CM | POA: Insufficient documentation

## 2018-02-21 DIAGNOSIS — Z79899 Other long term (current) drug therapy: Secondary | ICD-10-CM | POA: Insufficient documentation

## 2018-02-21 DIAGNOSIS — F17210 Nicotine dependence, cigarettes, uncomplicated: Secondary | ICD-10-CM | POA: Diagnosis not present

## 2018-02-21 DIAGNOSIS — I5022 Chronic systolic (congestive) heart failure: Secondary | ICD-10-CM | POA: Insufficient documentation

## 2018-02-21 DIAGNOSIS — I2510 Atherosclerotic heart disease of native coronary artery without angina pectoris: Secondary | ICD-10-CM | POA: Insufficient documentation

## 2018-02-21 DIAGNOSIS — R079 Chest pain, unspecified: Secondary | ICD-10-CM | POA: Diagnosis present

## 2018-02-21 DIAGNOSIS — I130 Hypertensive heart and chronic kidney disease with heart failure and stage 1 through stage 4 chronic kidney disease, or unspecified chronic kidney disease: Secondary | ICD-10-CM | POA: Diagnosis not present

## 2018-02-21 DIAGNOSIS — F1520 Other stimulant dependence, uncomplicated: Secondary | ICD-10-CM

## 2018-02-21 LAB — CBC
HCT: 41.3 % (ref 36.0–46.0)
HEMOGLOBIN: 13.5 g/dL (ref 12.0–15.0)
MCH: 29.3 pg (ref 26.0–34.0)
MCHC: 32.7 g/dL (ref 30.0–36.0)
MCV: 89.8 fL (ref 80.0–100.0)
PLATELETS: 470 10*3/uL — AB (ref 150–400)
RBC: 4.6 MIL/uL (ref 3.87–5.11)
RDW: 12.1 % (ref 11.5–15.5)
WBC: 12.1 10*3/uL — ABNORMAL HIGH (ref 4.0–10.5)
nRBC: 0 % (ref 0.0–0.2)

## 2018-02-21 LAB — I-STAT BETA HCG BLOOD, ED (MC, WL, AP ONLY): I-stat hCG, quantitative: <5 m[IU]/mL (ref <5)

## 2018-02-21 MED ORDER — NICOTINE 21 MG/24HR TD PT24
21.0000 mg | MEDICATED_PATCH | Freq: Once | TRANSDERMAL | Status: AC
  Administered 2018-02-21: 21 mg via TRANSDERMAL
  Filled 2018-02-21: qty 1

## 2018-02-21 MED ORDER — LORAZEPAM 2 MG/ML IJ SOLN
2.0000 mg | Freq: Once | INTRAMUSCULAR | Status: AC
  Administered 2018-02-21: 2 mg via INTRAMUSCULAR
  Filled 2018-02-21: qty 1
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
## 2018-02-21 NOTE — ED Notes (Signed)
Pt stated when asked for a urine sample: "I can't piss right now".

## 2018-02-21 NOTE — ED Notes (Signed)
Bed: WTR5 Expected date:  Expected time:  Means of arrival:  Comments: 

## 2018-02-21 NOTE — ED Notes (Addendum)
Pt is not calm enough to obtain and EKG or labs. Pt refusing to sit down in triage room. She is standing and pacing the room, slamming her hands on the table yelling, "I have a broken heart and I need somebody who gives a shit about me to come love me and help me. I'm in the middle of two medical crisis'".

## 2018-02-21 NOTE — ED Triage Notes (Signed)
Pt reports she has been doing "every drug she can get her hands on" Pt reports she is trying to "do ice to make her heart stop" Pt is writhing in the chair with half her breast out screaming to the lord to "Just take her" Pt reports she "needs psychiatric admission now" and is screaming and wailing to the ceiling.  Pt is waving her hands in the air and crying.

## 2018-02-21 NOTE — ED Notes (Signed)
Pt stated "I don't care if I die. I don't care if my heart stops." Triage RN Ilene aware.

## 2018-02-22 ENCOUNTER — Other Ambulatory Visit: Payer: Self-pay

## 2018-02-22 DIAGNOSIS — J111 Influenza due to unidentified influenza virus with other respiratory manifestations: Secondary | ICD-10-CM | POA: Diagnosis not present

## 2018-02-22 LAB — COMPREHENSIVE METABOLIC PANEL
ALBUMIN: 5.3 g/dL — AB (ref 3.5–5.0)
ALK PHOS: 72 U/L (ref 38–126)
ALT: 15 U/L (ref 0–44)
AST: 24 U/L (ref 15–41)
Anion gap: 13 (ref 5–15)
BUN: 21 mg/dL — ABNORMAL HIGH (ref 6–20)
CALCIUM: 9.3 mg/dL (ref 8.9–10.3)
CO2: 23 mmol/L (ref 22–32)
Chloride: 100 mmol/L (ref 98–111)
Creatinine, Ser: 0.75 mg/dL (ref 0.44–1.00)
GFR calc non Af Amer: >60 mL/min (ref >60)
Glucose, Bld: 120 mg/dL — ABNORMAL HIGH (ref 70–99)
Potassium: 2.9 mmol/L — ABNORMAL LOW (ref 3.5–5.1)
SODIUM: 136 mmol/L (ref 135–145)
Total Bilirubin: 2.7 mg/dL — ABNORMAL HIGH (ref 0.3–1.2)
Total Protein: 8.3 g/dL — ABNORMAL HIGH (ref 6.5–8.1)

## 2018-02-22 LAB — RAPID URINE DRUG SCREEN, HOSP PERFORMED
Amphetamines: POSITIVE — AB
BENZODIAZEPINES: NOT DETECTED
Barbiturates: NOT DETECTED
COCAINE: POSITIVE — AB
Opiates: NOT DETECTED
Tetrahydrocannabinol: POSITIVE — AB

## 2018-02-22 LAB — SALICYLATE LEVEL

## 2018-02-22 LAB — ACETAMINOPHEN LEVEL

## 2018-02-22 LAB — ETHANOL: Alcohol, Ethyl (B): <10 mg/dL (ref <10)

## 2018-02-22 MED ORDER — STERILE WATER FOR INJECTION IJ SOLN
INTRAMUSCULAR | Status: AC
  Filled 2018-02-22: qty 10

## 2018-02-22 MED ORDER — OLANZAPINE 10 MG IM SOLR
5.0000 mg | Freq: Once | INTRAMUSCULAR | Status: AC
  Administered 2018-02-22: 5 mg via INTRAMUSCULAR
  Filled 2018-02-22: qty 10

## 2018-02-22 MED ORDER — POTASSIUM CHLORIDE CRYS ER 20 MEQ PO TBCR
40.0000 meq | EXTENDED_RELEASE_TABLET | Freq: Once | ORAL | Status: AC
  Administered 2018-02-22: 40 meq via ORAL
  Filled 2018-02-22: qty 2

## 2018-02-22 MED ORDER — GABAPENTIN 300 MG PO CAPS
300.0000 mg | ORAL_CAPSULE | Freq: Three times a day (TID) | ORAL | Status: DC
  Administered 2018-02-22 – 2018-02-23 (×2): 300 mg via ORAL
  Filled 2018-02-22 (×2): qty 1

## 2018-02-22 MED ORDER — DIPHENHYDRAMINE HCL 50 MG/ML IJ SOLN
50.0000 mg | Freq: Once | INTRAMUSCULAR | Status: AC
  Administered 2018-02-22: 50 mg via INTRAMUSCULAR
  Filled 2018-02-22: qty 1

## 2018-02-22 MED ORDER — POTASSIUM CHLORIDE CRYS ER 20 MEQ PO TBCR
40.0000 meq | EXTENDED_RELEASE_TABLET | Freq: Two times a day (BID) | ORAL | Status: DC
  Administered 2018-02-22 – 2018-02-23 (×3): 40 meq via ORAL
  Filled 2018-02-22 (×3): qty 2

## 2018-02-22 MED ORDER — OLANZAPINE 5 MG PO TABS
5.0000 mg | ORAL_TABLET | Freq: Once | ORAL | Status: AC
  Administered 2018-02-22: 5 mg via ORAL
  Filled 2018-02-22: qty 1

## 2018-02-22 NOTE — ED Provider Notes (Signed)
[facility]-EMERGENCY DEPT Provider Note   CSN: [id number] Arrival date & time: 02/21/18  2236     History   Chief Complaint Chief Complaint  Patient presents with  . Schizophrenia  . Drug Problem  . Suicidal    HPI Tara Thompson is a 33 y.o. female.  Patient to ED by family extremely agitated, hyperactive, stating repeatedly "I want it all to end". She has a history of methamphetamine abuse and reports she has been using excessively trying to "make my heart stop". She denies HI/AVH.  The history is provided by the patient. No language interpreter was used.  Drug Problem     Past Medical History:  Diagnosis Date  . Abnormal Pap smear    LSIL>normal follow-up pap  . Anxiety   . Bipolar disorder (HCC)   . Depression   . Mental disorder   . Ovarian cyst     Patient Active Problem List   Diagnosis Date Noted  . Methamphetamine use disorder, moderate (HCC) 09/21/2017  . Substance induced mood disorder (HCC) 09/21/2017  . Cannabis use disorder, mild, abuse   . Encounter for female sterilization procedure 07/22/2013  . Chronic pelvic pain in female 04/07/2012    Past Surgical History:  Procedure Laterality Date  . COLPOSCOPY    . LAPAROSCOPIC TUBAL LIGATION Bilateral 07/22/2013   Procedure: LAPAROSCOPIC TUBAL LIGATION;  Surgeon: Antionette Char, MD;  Location: [facility];  Service: Gynecology;  Laterality: Bilateral;  . WISDOM TOOTH EXTRACTION  age 57     OB History    Gravida  1   Para  1   Term  1   Preterm      AB      Living  1     SAB      TAB      Ectopic      Multiple      Live Births               Home Medications    Prior to Admission medications   Medication Sig Start Date End Date Taking? Authorizing Provider  gabapentin (NEURONTIN) 300 MG capsule Take 1 capsule (300 mg total) by mouth 3 (three) times daily. For agitation 09/21/17   Charm Rings, NP  gabapentin (NEURONTIN) 300 MG capsule Take 1  capsule (300 mg total) by mouth 3 (three) times daily for 14 days. 09/22/17 10/06/17  Tegeler, Canary Brim, MD  OLANZapine (ZYPREXA) 10 MG tablet Take 1 tablet (10 mg total) by mouth 2 (two) times daily. 09/21/17   Charm Rings, NP  OLANZapine (ZYPREXA) 10 MG tablet Take 1 tablet (10 mg total) by mouth 2 (two) times daily for 14 days. 09/22/17 10/06/17  Tegeler, Canary Brim, MD    Family History Family History  Problem Relation Age of Onset  . Bipolar disorder Maternal Grandmother   . Cancer Paternal Grandmother   . Depression Maternal Uncle   . Depression Paternal Aunt   . Drug abuse Brother     Social History Social History   Tobacco Use  . Smoking status: Current Every Day Smoker    Packs/day: 0.50    Years: 11.00    Pack years: 5.50    Types: Cigarettes  . Smokeless tobacco: Never Used  Substance Use Topics  . Alcohol use: No    Alcohol/week: 2.0 standard drinks    Types: 1 Glasses of wine, 1 Cans of beer per week    Comment: occasional - one glass of  wine per month  . Drug use: No    Frequency: 7.0 times per week    Comment: Reports she quit THC early July 2014, used cocaine once in July of 2013     Allergies   Lamotrigine; Latuda [lurasidone hcl]; and Risperidone and related   Review of Systems Review of Systems  Unable to perform ROS: Other (acute psychiatric condition)     Physical Exam Updated Vital Signs BP (!) 134/98 (BP Location: Right Arm)   Pulse (!) 102   Temp 98 F (36.7 C) (Oral)   Resp (!) 24   Ht 5\' 1"  (1.549 m)   Wt 49 kg   SpO2 98%   BMI 20.41 kg/m   Physical Exam Constitutional:      Appearance: She is well-developed.     Comments: Hyperactive, restless, agitated  HENT:     Head: Normocephalic and atraumatic.  Neck:     Musculoskeletal: Normal range of motion and neck supple.  Cardiovascular:     Rate and Rhythm: Regular rhythm. Tachycardia present.  Pulmonary:     Effort: Pulmonary effort is normal.     Breath sounds:  Normal breath sounds.  Abdominal:     General: Bowel sounds are normal.     Palpations: Abdomen is soft.     Tenderness: There is no abdominal tenderness. There is no guarding or rebound.  Musculoskeletal: Normal range of motion.  Skin:    General: Skin is warm and dry.     Findings: No rash.  Neurological:     Mental Status: She is alert.  Psychiatric:        Mood and Affect: Mood is anxious. Affect is inappropriate.        Speech: Speech is rapid and pressured.        Behavior: Behavior is agitated and hyperactive.        Thought Content: Thought content includes suicidal ideation.      ED Treatments / Results  Labs (all labs ordered are listed, but only abnormal results are displayed) Labs Reviewed  COMPREHENSIVE METABOLIC PANEL - Abnormal; Notable for the following components:      Result Value   Potassium 2.9 (*)    Glucose, Bld 120 (*)    BUN 21 (*)    Total Protein 8.3 (*)    Albumin 5.3 (*)    Total Bilirubin 2.7 (*)    All other components within normal limits  CBC - Abnormal; Notable for the following components:   WBC 12.1 (*)    Platelets 470 (*)    All other components within normal limits  ETHANOL  SALICYLATE LEVEL  ACETAMINOPHEN LEVEL  RAPID URINE DRUG SCREEN, HOSP PERFORMED  I-STAT BETA HCG BLOOD, ED ([facility], [facility], [facility] ONLY)    EKG EKG Interpretation  Date/Time:  Sunday February 21 2018 23:23:14 EST Ventricular Rate:  98 PR Interval:    QRS Duration: 81 QT Interval:  354 QTC Calculation: 452 R Axis:   69 Text Interpretation:  Sinus rhythm LAE, consider biatrial enlargement Nonspecific T abnormalities, lateral leads Baseline wander in lead(s) III When compred to prior, faster rate.  No STEMI Confirmed by Theda Belfast ([id number]) on 02/21/2018 11:31:06 PM   Radiology No results found.  Procedures Procedures (including critical care time)  Medications Ordered in ED Medications  nicotine (NICODERM CQ - dosed in mg/24 hours) patch 21 mg (21 mg  Transdermal Patch Applied 02/21/18 2343)  LORazepam (ATIVAN) injection 2 mg (2 mg Intramuscular Given 02/21/18 2342)  Initial Impression / Assessment and Plan / ED Course  I have reviewed the triage vital signs and the nursing notes.  Pertinent labs & imaging results that were available during my care of the patient were reviewed by me and considered in my medical decision making (see chart for details).     Patient to ED making suicidal statements, confirming ongoing meth use. Ativan IM given. Pending labs for medical clearance. Nicotine patch provided at patient's request.   She is found to be hypokalemic. K-dur ordered. Will continue this for 2 additional days. Total bilirubin is elevated. This will need follow up but no acute intervention.   She is considered medically cleared for TTS consultation. UDS pending.   Final Clinical Impressions(s) / ED Diagnoses   Final diagnoses:  None   1. Suicidal 2. Substance abuse 3. Acute agitation 4. Hypokalemia  ED Discharge Orders    None       Elpidio AnisUpstill, Caelin Rayl, PA-C 02/22/18 0050    Zadie RhineWickline, Donald, MD 02/22/18 [phone]

## 2018-02-22 NOTE — ED Notes (Signed)
Pt cooperating currently. Pt now dressed into scrubs and belongings placed at nurses station.

## 2018-02-22 NOTE — Progress Notes (Signed)
Pt moved to SAPPU from triage. TTS went into the pt's room to see if the pt was alert enough to participate in the assessment having just moved from triage. Pt does not respond to this Clinical research associate when her name is called multiple times. TTS attempted to arouse the pt in order to complete the assessment but she continues to be unresponsive. TTS will assess when she is able to participate. ED staff advised.   Princess Bruins, MSW, LCSW Therapeutic Triage Specialist  [phone]

## 2018-02-22 NOTE — ED Notes (Signed)
Bed: [facility] Expected date:  Expected time:  Means of arrival:  Comments: [place]

## 2018-02-22 NOTE — BH Assessment (Addendum)
Assessment Note  Tara Thompson is an 33 y.o. female that presents this date voluntary with altered mental state. Patient could not initially be assessed on admission due to being impaired. UDS positive for cocaine, amphetamines and THC. Patient was seen by this writer at 1000 hours and is observed to be irritable and angry asking to be discharged. Patient renders limited history in reference to the incident that occurred earlier this date and is unable to report [facility]. Patient denies any S/I, H/I or AVH at the time of assessment. Patient denies any current mental health symptoms or any current/past history of OP care. Patient denies any current SA issues although as noted above patient is tested positive for cocaine, amphetamines and THC this date. Patient only answers "yes and no" to certain questions on the assessment. Patient will not make any eye contact with this writer and keeps her head covered up as this attempts to assess. Patient is observed to be speaking in a low soft voice being inaudible at times. Patient declines to participate in most of the assessment as this writer attempts to ask questions several times with no response. Information to complete assessment was obtained from admission notes and history. Per notes, patient presented to ED by family extremely agitated, hyperactive, stating repeatedly "I want it all to end". She has a history of methamphetamine abuse and reports she has been using excessively trying to "make my heart stop". She denies HI/AVH. Patient denies any S/I at the time of assessment. Per notes, patient was last seen on 09/22/17 when she presented for depression with thoughts of walking into traffic. She has not had a suicide attempt in the past per that note. The patient also stated at that time that she is currently not seeing a counselor or a psychiatrist. She has been hospitalized in the past with the most recent time being 01/2017.  She has a  history of methamphetamine abuse and reports she has been using excessively trying to "deal with her issues". She denies S/IHI/AVH. Case was staffed with Inez PilgrimNorman DO, Lord DNP who also evaluated patient and recommended patient be discharged. Patient declined any OP resources or peer support consult to assist with current issues.   Diagnosis: Substance induced mood disorder   Past Medical History:  Past Medical History:  Diagnosis Date  . Abnormal Pap smear    LSIL>normal follow-up pap  . Anxiety   . Bipolar disorder (HCC)   . Depression   . Mental disorder   . Ovarian cyst     Past Surgical History:  Procedure Laterality Date  . COLPOSCOPY    . LAPAROSCOPIC TUBAL LIGATION Bilateral 07/22/2013   Procedure: LAPAROSCOPIC TUBAL LIGATION;  Surgeon: Antionette CharLisa Jackson-Moore, MD;  Location: [facility];  Service: Gynecology;  Laterality: Bilateral;  . WISDOM TOOTH EXTRACTION  age 33    Family History:  Family History  Problem Relation Age of Onset  . Bipolar disorder Maternal Grandmother   . Cancer Paternal Grandmother   . Depression Maternal Uncle   . Depression Paternal Aunt   . Drug abuse Brother     Social History:  reports that she has been smoking cigarettes. She has a 5.50 pack-year smoking history. She has never used smokeless tobacco. She reports that she does not drink alcohol or use drugs.  Additional Social History:  Alcohol / Drug Use Pain Medications: See MAR Prescriptions: See MAR Over the Counter: See MAR History of alcohol / drug use?: Yes Longest period of  sobriety (when/how long): unknown Negative Consequences of Use: Legal(Per hx) Withdrawal Symptoms: (Denies) Substance #1 Name of Substance 1: Cocaine per UDS 1 - Age of First Use: UTA 1 - Amount (size/oz): UTA 1 - Frequency: UTA 1 - Duration: UTA 1 - Last Use / Amount: UTA UDS post this date cocaine Substance #2 Name of Substance 2: Amphetamines 2 - Age of First Use: UTA 2 - Amount (size/oz): UTA 2 - Frequency:  UTA 2 - Duration: UTA 2 - Last Use / Amount: UTA post this date  CIWA: CIWA-Ar BP: 98/63 Pulse Rate: 89 COWS:    Allergies:  Allergies  Allergen Reactions  . Lamotrigine Hives  . Latuda [Lurasidone Hcl] Other (See Comments)    Thoughts of hurting herself  . Risperidone And Related Other (See Comments)    hallucinations    Home Medications: (Not in a hospital admission)   OB/GYN Status:  No LMP recorded. (Menstrual status: Other).  General Assessment Data Assessment unable to be completed: Yes Reason for not completing assessment: pt given ativan due to agitation. Pt unable to be aroused for TTS assessment Location of Assessment: [facility] ED TTS Assessment: In system Is this a Tele or Face-to-Face Assessment?: Face-to-Face Is this an Initial Assessment or a Re-assessment for this encounter?: Initial Assessment Patient Accompanied by:: N/A Language Other than English: No Living Arrangements: Other (Comment)(Alone) What gender do you identify as?: Female Marital status: Single Maiden name: Adcock Pregnancy Status: No Living Arrangements: Alone Can pt return to current living arrangement?: Yes Admission Status: Voluntary Is patient capable of signing voluntary admission?: Yes Referral Source: Self/Family/Friend Insurance type: Medicare     Crisis Care Plan Living Arrangements: Alone Legal Guardian: (NA) Name of Psychiatrist: None Name of Therapist: None  Education Status Is patient currently in school?: No Is the patient employed, unemployed or receiving disability?: Unemployed  Risk to self with the past 6 months Suicidal Ideation: No Has patient been a risk to self within the past 6 months prior to admission? : Yes Suicidal Intent: No Has patient had any suicidal intent within the past 6 months prior to admission? : Yes Is patient at risk for suicide?: Yes Suicidal Plan?: No Has patient had any suicidal plan within the past 6 months prior to admission? :  Yes Access to Means: (Unk) What has been your use of drugs/alcohol within the last 12 months?: Current use Previous Attempts/Gestures: Yes How many times?: 1 Other Self Harm Risks: (Excessive SA use) Triggers for Past Attempts: Unknown Intentional Self Injurious Behavior: None Family Suicide History: No Recent stressful life event(s): Other (Comment)(Excessive SA use) Persecutory voices/beliefs?: No Depression: No Depression Symptoms: (Pt denies) Substance abuse history and/or treatment for substance abuse?: No Suicide prevention information given to non-admitted patients: Not applicable  Risk to Others within the past 6 months Homicidal Ideation: No Does patient have any lifetime risk of violence toward others beyond the six months prior to admission? : No Thoughts of Harm to Others: No Current Homicidal Intent: No Current Homicidal Plan: No Access to Homicidal Means: No Identified Victim: NA History of harm to others?: No Assessment of Violence: None Noted Violent Behavior Description: NA Does patient have access to weapons?: No Criminal Charges Pending?: No Does patient have a court date: No Is patient on probation?: No  Psychosis Hallucinations: None noted Delusions: None noted  Mental Status Report Appearance/Hygiene: In scrubs Eye Contact: Unable to Assess Motor Activity: Restlessness, Hyperactivity Speech: Soft, Slow Level of Consciousness: Drowsy, Irritable Mood: Angry Affect: Irritable  Anxiety Level: Minimal Thought Processes: Unable to Assess Judgement: Unable to Assess Orientation: Unable to assess Obsessive Compulsive Thoughts/Behaviors: Unable to Assess  Cognitive Functioning Concentration: Unable to Assess Memory: Unable to Assess Is patient IDD: No Insight: Unable to Assess Impulse Control: Unable to Assess Appetite: (UTA) Have you had any weight changes? : (UTA) Sleep: (UTA) Total Hours of Sleep: (UTA) Vegetative Symptoms:  None  ADLScreening [facility] Assessment Services) Patient's cognitive ability adequate to safely complete daily activities?: Yes Patient able to express need for assistance with ADLs?: Yes Independently performs ADLs?: Yes (appropriate for developmental age)  Prior Inpatient Therapy Prior Inpatient Therapy: Yes Prior Therapy Dates: 2018 [facility] Reason for Treatment: MH issues  Prior Outpatient Therapy Prior Outpatient Therapy: No Does patient have an ACCT team?: No Does patient have Intensive In-House Services?  : No Does patient have [facility] services? : No Does patient have [facility] services?: No  ADL Screening (condition at time of admission) Patient's cognitive ability adequate to safely complete daily activities?: Yes Is the patient deaf or have difficulty hearing?: No Does the patient have difficulty seeing, even when wearing glasses/contacts?: No Does the patient have difficulty concentrating, remembering, or making decisions?: No Patient able to express need for assistance with ADLs?: Yes Does the patient have difficulty dressing or bathing?: No Independently performs ADLs?: Yes (appropriate for developmental age) Does the patient have difficulty walking or climbing stairs?: No Weakness of Legs: None Weakness of Arms/Hands: None  Home Assistive Devices/Equipment Home Assistive Devices/Equipment: None  Therapy Consults (therapy consults require a physician order) PT Evaluation Needed: No OT Evalulation Needed: No SLP Evaluation Needed: No Abuse/Neglect Assessment (Assessment to be complete while patient is alone) Physical Abuse: Denies Verbal Abuse: Denies Sexual Abuse: Denies Exploitation of patient/patient's resources: Denies Self-Neglect: Denies Values / Beliefs Cultural Requests During Hospitalization: None Spiritual Requests During Hospitalization: None Consults Spiritual Care Consult Needed: No Social Work Consult Needed:  No Merchant navy officerAdvance Directives (For Healthcare) Does Patient Have a Medical Advance Directive?: No Would patient like information on creating a medical advance directive?: No - Patient declined          Disposition: Case was staffed with Inez PilgrimNorman DO, Lord DNP who also evaluated patient and recommended patient be discharged. Patient declined any OP resources or peer support consult to assist with current issues Disposition Initial Assessment Completed for this Encounter: Yes Disposition of Patient: Discharge Patient refused recommended treatment: No Mode of transportation if patient is discharged/movement?: (Unk) Patient referred to: Outpatient clinic referral  On Site Evaluation by:   Reviewed with Physician:    Alfredia Fergusonavid L Disaya Walt 02/22/2018 10:21 AM

## 2018-02-22 NOTE — ED Notes (Signed)
Pt presents with polysubstance abuse and passive SI thoughts.  Pt A&O x 3, no distress noted, calm & cooperative at present.  Pt resting at present.  Monitoring for safety, Q 15 min checks in effect.

## 2018-02-22 NOTE — [facility] Suicide Risk Assessment (Signed)
Suicide Risk Assessment  Discharge Assessment   [facility] Discharge Suicide Risk Assessment   Principal Problem: Substance induced mood disorder (HCC) Discharge Diagnoses: Principal Problem:   Substance induced mood disorder (HCC) Active Problems:   Methamphetamine use disorder, moderate (HCC)   Total Time spent with patient: 45 minutes  Musculoskeletal: Strength & Muscle Tone: within normal limits Gait & Station: normal Patient leans: N/A  Psychiatric Specialty Exam:   Blood pressure 98/63, pulse 89, temperature 98.1 F (36.7 C), temperature source Oral, resp. rate 20, height 5\' 1"  (1.549 m), weight 49 kg, SpO2 97 %.Body mass index is 20.41 kg/m.  General Appearance: Casual  Eye Contact::  Good  Speech:  Normal Rate409  Volume:  Normal  Mood:  Anxious  Affect:  Congruent  Thought Process:  Coherent and Descriptions of Associations: Intact  Orientation:  Full (Time, Place, and Person)  Thought Content:  WDL and Logical  Suicidal Thoughts:  No  Homicidal Thoughts:  No  Memory:  Immediate;   Good Recent;   Good Remote;   Good  Judgement:  Fair  Insight:  Fair  Psychomotor Activity:  Normal  Concentration:  Good  Recall:  Good  Fund of Knowledge:Fair  Language: Good  Akathisia:  No  Handed:  Right  AIMS (if indicated):     Assets:  Leisure Time Physical Health Resilience Social Support  Sleep:     Cognition: WNL  ADL's:  Intact   Mental Status Per Nursing Assessment::   On Admission:   33 yo female who presented to the ED after using meth and cocaine with an increase in depression and anxiety.  No suicidal/homicidal ideations, hallucinations, or withdrawal symptoms.  Interested in meeting with peer support for outpatient resources.  Stable for discharge.  Demographic Factors:  Caucasian  Loss Factors: NA  Historical Factors: NA  Risk Reduction Factors:   Sense of responsibility to family, Living with another person, especially a relative and Positive social  support  Continued Clinical Symptoms:  Anxiety,mild  Cognitive Features That Contribute To Risk:  None    Suicide Risk:  Minimal: No identifiable suicidal ideation.  Patients presenting with no risk factors but with morbid ruminations; may be classified as minimal risk based on the severity of the depressive symptoms    Plan Of Care/Follow-up recommendations:  Activity:  as tolerated Diet:  heart healthy diet  Alvine Mostafa, NP 02/22/2018, 10:11 AM

## 2018-02-22 NOTE — Progress Notes (Signed)
pt given ativan due to agitation. Pt unable to be aroused for TTS assessment

## 2018-02-22 NOTE — ED Notes (Signed)
Pt became loud and agitated after seeing physician.  Pt was calm in front of providers but gradually became more and more manic.  She was having weird limb movements, becoming loud and tearful.  She made multiple phone calls that agitated her more and was screaming and cursing at times.

## 2018-02-22 NOTE — BH Assessment (Signed)
[facility] Assessment Progress Note  Case was staffed with Inez Pilgrim DNP who also evaluated patient and recommended patient be discharged. Patient declined any OP resources or peer support consult to assist with current issues

## 2018-02-22 NOTE — Discharge Instructions (Signed)
For your behavioral health needs, you are advised to follow up with one of the following providers:  IN [place]:       [facility]      [address]      [place], [place] [address]      [phone]      New and returning patients are seen at their walk-in clinic.  Walk-in hours are Monday - Friday from 8:00 am - 3:00 pm.  Walk-in patients are seen on a first come, first served basis.  Try to arrive as early as possible for he best chance of being seen the same day.  IN [place]:       [facility]      [address], [place] [address]      [phone]

## 2018-02-22 NOTE — BH Assessment (Signed)
[facility] Assessment Progress Note  Per Juanetta Beets, DO, this pt does not require psychiatric hospitalization at this time.  Pt is to be discharged from [facility] with recommendation to follow up with [facility] locally, or with [facility] in [place], pt's community or residence.  This has been included in pt's discharge instructions.  Pt would also benefit from seeing Peer Support Specialists; they will be asked to speak to pt.  Pt's nurse, Kendal Hymen, has been notified.  Doylene Canning, MA Triage Specialist [phone]

## 2018-02-23 DIAGNOSIS — J111 Influenza due to unidentified influenza virus with other respiratory manifestations: Secondary | ICD-10-CM | POA: Diagnosis not present

## 2018-02-23 MED ORDER — OLANZAPINE 5 MG PO TABS
5.0000 mg | ORAL_TABLET | Freq: Two times a day (BID) | ORAL | Status: DC
  Administered 2018-02-23: 5 mg via ORAL
  Filled 2018-02-23: qty 1

## 2018-02-23 MED ORDER — LORAZEPAM 1 MG PO TABS
1.0000 mg | ORAL_TABLET | Freq: Once | ORAL | Status: AC
  Administered 2018-02-23: 1 mg via ORAL
  Filled 2018-02-23: qty 1

## 2018-02-23 MED ORDER — NICOTINE 21 MG/24HR TD PT24
21.0000 mg | MEDICATED_PATCH | Freq: Once | TRANSDERMAL | Status: DC
  Administered 2018-02-23: 21 mg via TRANSDERMAL
  Filled 2018-02-23: qty 1

## 2018-02-23 NOTE — ED Notes (Signed)
Patient is alert and oriented to person, place and time.  Patient presents with anxious affect and irritable mood.  Denies suicidal thoughts, auditory and visual hallucinations.  Medications given as prescribed.  Ativan 1 mg given this morning for agitation with good effect.  Argumentative on the phone with her father over getting her car and phone back.  Redirected several times on the unit while awake.  Routine safety checks maintained every 15 minutes and via security camera. Verbalizes understanding of discharge plan and instructions.

## 2018-02-23 NOTE — Patient Outreach (Signed)
CPSS met with the patient to provide substance use recovery support and help with substance use recovery resources. CPSS provided an NA meeting list for [place], [place]. Patient has been to [facility] in [facility] in the past for outpatient mental health/susbtance use treatment. Follow up information for [facility] in [place] will be in her discharge instructions. CPSS provided CPSS contact information. CPSS strongly encouraged the patient to stay in contact with CPSS for substance use recovery support or if the patient has any further questions on how to get connected to substance use treatment resources.

## 2018-02-23 NOTE — Consult Note (Signed)
Patient was planned for discharge yesterday but this did not occur for unknown reasons. She was seen today. She reports that she is "alive." She slept well overnight. She reports an improvement in her withdrawal symptoms. She denies SI, HI or AVH. She plans to follow up at [facility] for medication management and substance abuse treatment. She is able to safety plan. She does not warrant inpatient psychiatric hospitalization at this time.   Tara Beets, DO 02/23/18 1:02 PM

## 2018-02-23 NOTE — ED Notes (Signed)
Asleep all of this shift except for the few minutes writer woke her for her 2200 meds. She was cooperative with the meds and took them without difficulty. After taking the meds writer asked her if she was cold, said yes and when writer returned immediately with two blankets she had already returned to sleep and is sleeping now. Unable to assess her for mental status due to sleeping all of the writers shift.

## 2018-02-23 NOTE — BH Assessment (Signed)
BHH Assessment Progress Note  Pt has remained at [facility] overnight for further stabilization, and per Juanetta Beets, DO, is now ready for discharge from [facility].  Pt's discharge instructions advise pt to follow up with [facility] in the [facility] area, or with [facility] in [place], pt's community of residence.  Pt would also benefit from seeing [facility]; they will be asked to speak to pt.  Pt's nurse, Lanora Manis, has been notified.  Doylene Canning, [place] [facility] Coordinator [phone]

## 2024-03-18 ENCOUNTER — Emergency Department (HOSPITAL_COMMUNITY)

## 2024-03-18 ENCOUNTER — Other Ambulatory Visit: Payer: Self-pay

## 2024-03-18 ENCOUNTER — Emergency Department (HOSPITAL_COMMUNITY)
Admission: EM | Admit: 2024-03-18 | Source: Home / Self Care | Attending: Emergency Medicine | Admitting: Emergency Medicine

## 2024-03-18 ENCOUNTER — Encounter (HOSPITAL_COMMUNITY): Payer: Self-pay | Admitting: *Deleted

## 2024-03-18 DIAGNOSIS — F309 Manic episode, unspecified: Secondary | ICD-10-CM

## 2024-03-18 DIAGNOSIS — R109 Unspecified abdominal pain: Secondary | ICD-10-CM

## 2024-03-18 LAB — URINALYSIS, ROUTINE W REFLEX MICROSCOPIC
Bilirubin Urine: NEGATIVE
Glucose, UA: NEGATIVE mg/dL
Ketones, ur: 20 mg/dL — AB
Nitrite: NEGATIVE
Protein, ur: 30 mg/dL — AB
Specific Gravity, Urine: 1.019 (ref 1.005–1.030)
pH: 5 (ref 5.0–8.0)

## 2024-03-18 LAB — CBC WITH DIFFERENTIAL/PLATELET
Abs Immature Granulocytes: 0.02 10*3/uL (ref 0.00–0.07)
Basophils Absolute: 0.1 10*3/uL (ref 0.0–0.1)
Basophils Relative: 1 %
Eosinophils Absolute: 0.1 10*3/uL (ref 0.0–0.5)
Eosinophils Relative: 1 %
HCT: 38.8 % (ref 36.0–46.0)
Hemoglobin: 13.4 g/dL (ref 12.0–15.0)
Immature Granulocytes: 0 %
Lymphocytes Relative: 29 %
Lymphs Abs: 3.2 10*3/uL (ref 0.7–4.0)
MCH: 29.7 pg (ref 26.0–34.0)
MCHC: 34.5 g/dL (ref 30.0–36.0)
MCV: 86 fL (ref 80.0–100.0)
Monocytes Absolute: 0.8 10*3/uL (ref 0.1–1.0)
Monocytes Relative: 7 %
Neutro Abs: 7.1 10*3/uL (ref 1.7–7.7)
Neutrophils Relative %: 62 %
Platelets: 485 10*3/uL — ABNORMAL HIGH (ref 150–400)
RBC: 4.51 MIL/uL (ref 3.87–5.11)
RDW: 12.6 % (ref 11.5–15.5)
WBC: 11.3 10*3/uL — ABNORMAL HIGH (ref 4.0–10.5)
nRBC: 0 % (ref 0.0–0.2)

## 2024-03-18 LAB — COMPREHENSIVE METABOLIC PANEL WITH GFR
ALT: 17 U/L (ref 0–44)
AST: 35 U/L (ref 15–41)
Albumin: 4.9 g/dL (ref 3.5–5.0)
Alkaline Phosphatase: 78 U/L (ref 38–126)
Anion gap: 15 (ref 5–15)
BUN: 9 mg/dL (ref 6–20)
CO2: 22 mmol/L (ref 22–32)
Calcium: 9.2 mg/dL (ref 8.9–10.3)
Chloride: 102 mmol/L (ref 98–111)
Creatinine, Ser: 0.67 mg/dL (ref 0.44–1.00)
GFR, Estimated: >60 mL/min
Glucose, Bld: 98 mg/dL (ref 70–99)
Potassium: 3.7 mmol/L (ref 3.5–5.1)
Sodium: 139 mmol/L (ref 135–145)
Total Bilirubin: 1.2 mg/dL (ref 0.0–1.2)
Total Protein: 7.8 g/dL (ref 6.5–8.1)

## 2024-03-18 LAB — LIPASE, BLOOD: Lipase: 48 U/L (ref 11–51)

## 2024-03-18 LAB — PREGNANCY, URINE: Preg Test, Ur: NEGATIVE

## 2024-03-18 LAB — ETHANOL: Alcohol, Ethyl (B): <15 mg/dL

## 2024-03-18 MED ORDER — STERILE WATER FOR INJECTION IJ SOLN
INTRAMUSCULAR | Status: AC
  Filled 2024-03-18: qty 10

## 2024-03-18 MED ORDER — LORAZEPAM 1 MG PO TABS
1.0000 mg | ORAL_TABLET | ORAL | Status: AC | PRN
  Administered 2024-03-18: 1 mg via ORAL
  Filled 2024-03-18: qty 1

## 2024-03-18 MED ORDER — ACETAMINOPHEN 325 MG PO TABS: 650.0000 mg | ORAL_TABLET | ORAL | Status: AC | PRN

## 2024-03-18 MED ORDER — OLANZAPINE 5 MG PO TBDP: 5.0000 mg | ORAL_TABLET | Freq: Three times a day (TID) | ORAL | Status: AC | PRN

## 2024-03-18 MED ORDER — ZIPRASIDONE MESYLATE 20 MG IM SOLR
20.0000 mg | Freq: Once | INTRAMUSCULAR | Status: AC
  Administered 2024-03-18: 20 mg via INTRAMUSCULAR
  Filled 2024-03-18: qty 20

## 2024-03-18 MED ORDER — OLANZAPINE 10 MG PO TABS
10.0000 mg | ORAL_TABLET | Freq: Two times a day (BID) | ORAL | Status: DC
  Administered 2024-03-18: 10 mg via ORAL
  Filled 2024-03-18: qty 1

## 2024-03-18 MED ORDER — IOHEXOL 350 MG/ML SOLN
50.0000 mL | Freq: Once | INTRAVENOUS | Status: AC | PRN
  Administered 2024-03-18: 50 mL via INTRAVENOUS

## 2024-03-18 NOTE — ED Triage Notes (Signed)
 Patient POV for abdominal pain, nausea, and vomiting. Patient with hx of abdominal surgery in 2024. Patient has poor PO intake. Patient endorses pain x 7 - 8 days.

## 2024-03-18 NOTE — ED Provider Triage Note (Signed)
 Emergency Medicine Provider Triage Evaluation Note  Tara Thompson , a 39 y.o. female  was evaluated in triage.  Pt complains of abdominal pain.  Patient reports that she is having some abdominal pain for the past 10 days.  Patient reports that she has a history of having abdominal surgery.  She reports having some diarrhea and some vomiting.  She reports being seen at a hospital in [facility] and recently moved to [facility].  She has been having issues getting her medications.  She reports 10 out of 10 abdominal pain.  Nothing seems to make it better or worse.  The last time she vomited was about 3 days ago.  Review of Systems  Positive: Abdominal pain Negative:   Physical Exam  BP 133/73 (BP Location: Right Arm)   Pulse 89   Temp (!) 97.5 F (36.4 C) (Oral)   Resp 12   Ht 5' 1 (1.549 m)   Wt 49 kg   LMP 02/26/2024   SpO2 97%   BMI 20.41 kg/m  Gen:   Awake, no distress   Resp:  Normal effort  MSK:   Moves extremities without difficulty  Other:    Medical Decision Making  Medically screening exam initiated at 7:27 PM.  Appropriate orders placed.  Lum QUIANA COBAUGH was informed that the remainder of the evaluation will be completed by another provider, this initial triage assessment does not replace that evaluation, and the importance of remaining in the ED until their evaluation is complete.     Rosaline Almarie MATSU, [place] 03/18/24 1928

## 2024-03-18 NOTE — ED Provider Notes (Signed)
 " [place] EMERGENCY DEPART[facility] AT [facility] Provider Note   CSN: [id number] Arrival date & time: 03/18/24  8180     Patient presents with: Abdominal Pain, Nausea, and Emesis   Tara Thompson is a 39 y.o. female.    Abdominal Pain Associated symptoms: vomiting   Emesis Associated symptoms: abdominal pain      Patient has a history of anxiety bipolar disorder depression.  Patient initially presented to the ED with complaints of abdominal pain ongoing for the last 10 days.  She had mentioned some intermittent episodes of vomiting and diarrhea.  Patient tells me that she is having a lot of stress.  She needs assistance with getting her medical records from the old [facility].  She also needs to get information from [facility].  Patient does think she needs to be at the mental health hospital at [facility].  Prior to Admission medications  Medication Sig Start Date End Date Taking? Authorizing Provider  gabapentin  (NEURONTIN ) 300 MG capsule Take 1 capsule (300 mg total) by mouth 3 (three) times daily. For agitation Patient not taking: Reported on 02/22/2018 09/21/17   Jacquetta Sharlot GRADE, NP  OLANZapine  (ZYPREXA ) 10 MG tablet Take 1 tablet (10 mg total) by mouth 2 (two) times daily. Patient not taking: Reported on 02/22/2018 09/21/17   Jacquetta Sharlot GRADE, NP    Allergies: Lamotrigine, Latuda [lurasidone hcl], and Risperidone  and paliperidone    Review of Systems  Gastrointestinal:  Positive for abdominal pain and vomiting.    Updated Vital Signs BP 133/73 (BP Location: Right Arm)   Pulse 89   Temp (!) 97.5 F (36.4 C) (Oral)   Resp 12   Ht 1.549 m (5' 1)   Wt 49 kg   LMP 02/26/2024   SpO2 97%   BMI 20.41 kg/m   Physical Exam Vitals and nursing note reviewed.  Constitutional:      Appearance: She is well-developed. She is not diaphoretic.  HENT:     Head: Normocephalic and atraumatic.     Right Ear: External ear normal.     Left Ear: External  ear normal.  Eyes:     General: No scleral icterus.       Right eye: No discharge.        Left eye: No discharge.     Conjunctiva/sclera: Conjunctivae normal.  Neck:     Trachea: No tracheal deviation.  Cardiovascular:     Rate and Rhythm: Normal rate and regular rhythm.  Pulmonary:     Effort: Pulmonary effort is normal. No respiratory distress.     Breath sounds: Normal breath sounds. No stridor. No wheezing or rales.  Abdominal:     General: Bowel sounds are normal. There is no distension.     Palpations: Abdomen is soft.     Tenderness: There is no abdominal tenderness. There is no guarding or rebound.  Musculoskeletal:        General: No tenderness or deformity.     Cervical back: Neck supple.  Skin:    General: Skin is warm and dry.     Findings: No rash.  Neurological:     General: No focal deficit present.     Mental Status: She is alert.     Cranial Nerves: No cranial nerve deficit, dysarthria or facial asymmetry.     Sensory: No sensory deficit.     Motor: No abnormal muscle tone or seizure activity.     Coordination: Coordination normal.  Psychiatric:        Mood and Affect: Mood is anxious. Affect is labile.        Speech: Speech is rapid and pressured and tangential.        Behavior: Behavior is hyperactive. Behavior is not aggressive.        Thought Content: Thought content is delusional.     (all labs ordered are listed, but only abnormal results are displayed) Labs Reviewed  CBC WITH DIFFERENTIAL/PLATELET - Abnormal; Notable for the following components:      Result Value   WBC 11.3 (*)    Platelets 485 (*)    All other components within normal limits  URINALYSIS, ROUTINE W REFLEX MICROSCOPIC - Abnormal; Notable for the following components:   APPearance CLOUDY (*)    Hgb urine dipstick SMALL (*)    Ketones, ur 20 (*)    Protein, ur 30 (*)    Leukocytes,Ua SMALL (*)    Bacteria, UA RARE (*)    All other components within normal limits   COMPREHENSIVE METABOLIC PANEL WITH GFR  LIPASE, BLOOD  PREGNANCY, URINE  ETHANOL  URINE DRUG SCREEN    EKG: None  Radiology: CT ABDOMEN PELVIS W CONTRAST Result Date: 03/18/2024 EXAM: CT ABDOMEN AND PELVIS WITH CONTRAST 03/18/2024 09:48:00 PM TECHNIQUE: CT of the abdomen and pelvis was performed with the administration of 50 mL iohexol  (OMNIPAQUE ) 350 MG/ML injection. Multiplanar reformatted images are provided for review. Automated exposure control, iterative reconstruction, and/or weight-based adjustment of the mA/kV was utilized to reduce the radiation dose to as low as reasonably achievable. COMPARISON: 03/25/2012 CLINICAL HISTORY: 10 days of abdominal pain. Nausea, vomiting. FINDINGS: LOWER CHEST: No acute abnormality. LIVER: The liver is unremarkable. GALLBLADDER AND BILE DUCTS: Gallbladder is unremarkable. No biliary ductal dilatation. SPLEEN: No acute abnormality. PANCREAS: No acute abnormality. ADRENAL GLANDS: No acute abnormality. KIDNEYS, URETERS AND BLADDER: No stones in the kidneys or ureters. No hydronephrosis. No perinephric or periureteral stranding. Urinary bladder is unremarkable. GI AND BOWEL: The appendix is not clearly identified, however, there are no secondary signs of appendicitis within the right lower quadrant. The stomach, small bowel, and large bowel are otherwise unremarkable. There is no bowel obstruction. PERITONEUM AND RETROPERITONEUM: No ascites. No free air. VASCULATURE: Aorta is normal in caliber. LYMPH NODES: No lymphadenopathy. REPRODUCTIVE ORGANS: No acute abnormality. BONES AND SOFT TISSUES: No acute osseous abnormality. No focal soft tissue abnormality. IMPRESSION: 1. No acute findings in the abdomen or pelvis. Electronically signed by: Dorethia Molt MD 03/18/2024 10:04 PM EST RP Workstation: [id number]     Procedures   Medications Ordered in the ED  acetaminophen  (TYLENOL ) tablet 650 mg (has no administration in time range)  OLANZapine  (ZYPREXA ) tablet 10  mg (has no administration in time range)  iohexol  (OMNIPAQUE ) 350 MG/ML injection 50 mL (50 mLs Intravenous Contrast Given 03/18/24 2149)    Clinical Course as of 03/18/24 2253  [facility] Mar 18, 2024  2229 CT ABDOMEN PELVIS W CONTRAST Normal [JK]  2229 CBC with Differential(!) White blood cell count slightly elevated [JK]  2229 Comprehensive metabolic panel Normal [JK]  2229 Lipase, blood Normal [JK]    Clinical Course User Index [JK] Randol Simmonds, MD                                 Medical Decision Making Problems Addressed: Abdominal pain, unspecified abdominal location: acute illness or injury Manic episode [facility]): acute illness or injury  that poses a threat to life or bodily functions  Amount and/or Complexity of Data Reviewed Labs: ordered. Decision-making details documented in ED Course. Radiology: ordered and independent interpretation performed. Decision-making details documented in ED Course.  Risk OTC drugs. Prescription drug management.   Patient initially complained of difficulty with abdominal pain nausea and vomiting.  Patient's ED workup is unremarkable.  No signs of hepatitis pancreatitis.  Her CT scan did not show any acute process.  On my assessment however the patient is having very rapid pressured speech.  She was in the waiting room in disruptive towards other patients in the waiting room.  Patient appears to be having some delusions.  She appears to be having a manic episode.  She currently agrees to see one of the mental health providers.  The patient has been placed in psychiatric observation due to the need to provide a safe environment for the patient while obtaining psychiatric consultation and evaluation, as well as ongoing medical and medication management to treat the patient's condition.       Final diagnoses:  Abdominal pain, unspecified abdominal location  Manic episode [facility])    ED Discharge Orders     None          Randol Simmonds,  MD 03/18/24 2253  "

## 2024-03-18 NOTE — ED Notes (Signed)
 Pt has been pacing in the lobby for a few hours, and speaking to other patients in the lobby, unwarranted and getting increasingly loud. Pt racing through lobby stating, I want to speak to an officer! I have PTSD!. Pt given opportunity to speak with an officer, then states she wants to call her dad and leave and go back to [employer]. Pt now lying on the floor in the lobby, security is present. Dr. Randol has been notified and he wil assess her shortly.

## 2024-03-18 NOTE — BH Assessment (Signed)
 Patient was deferred to [facility] for a telepsych assessment. The assigned care coordinator will provide updates regarding the scheduling of the assessment. [facility] coordinator can be reached at [phone] for further information on the timing of the telepsych evaluation.
